# Patient Record
Sex: Female | Born: 1995 | Hispanic: No | Marital: Married | Smoking: Never smoker
Health system: Southern US, Community
[De-identification: ages and names within clinical notes are randomized; demographics above are authoritative.]

## PROBLEM LIST (undated history)

## (undated) ENCOUNTER — Inpatient Hospital Stay (HOSPITAL_COMMUNITY): Payer: Self-pay

## (undated) HISTORY — PX: NO PAST SURGERIES: SHX2092

---
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
## 2014-11-08 ENCOUNTER — Emergency Department (HOSPITAL_COMMUNITY)
Admission: EM | Admit: 2014-11-08 | Discharge: 2014-11-08 | Disposition: A | Discharge status: Home / Self Care | Payer: Medicaid Other | Attending: Emergency Medicine | Admitting: Emergency Medicine

## 2014-11-08 ENCOUNTER — Encounter (HOSPITAL_COMMUNITY): Payer: Self-pay | Admitting: Emergency Medicine

## 2014-11-08 DIAGNOSIS — L03116 Cellulitis of left lower limb: Secondary | ICD-10-CM | POA: Diagnosis not present

## 2014-11-08 DIAGNOSIS — L02419 Cutaneous abscess of limb, unspecified: Secondary | ICD-10-CM

## 2014-11-08 DIAGNOSIS — L02416 Cutaneous abscess of left lower limb: Secondary | ICD-10-CM | POA: Diagnosis present

## 2014-11-08 DIAGNOSIS — L03119 Cellulitis of unspecified part of limb: Secondary | ICD-10-CM

## 2014-11-08 LAB — POC URINE PREG, ED: PREG TEST UR: NEGATIVE

## 2014-11-08 MED ORDER — IBUPROFEN 800 MG PO TABS: 800.0000 mg | ORAL_TABLET | Freq: Three times a day (TID) | ORAL | Status: DC | PRN

## 2014-11-08 MED ORDER — SULFAMETHOXAZOLE-TRIMETHOPRIM 800-160 MG PO TABS: 1.0000 | ORAL_TABLET | Freq: Two times a day (BID) | ORAL | Status: AC

## 2014-11-08 MED ORDER — LIDOCAINE HCL (PF) 1 % IJ SOLN
30.0000 mL | Freq: Once | INTRAMUSCULAR | Status: AC
  Administered 2014-11-08: 30 mL
  Filled 2014-11-08: qty 30

## 2014-11-08 MED ORDER — CEPHALEXIN 500 MG PO CAPS: 500.0000 mg | ORAL_CAPSULE | Freq: Four times a day (QID) | ORAL | Status: DC

## 2014-11-08 MED ORDER — TETANUS-DIPHTH-ACELL PERTUSSIS 5-2.5-18.5 LF-MCG/0.5 IM SUSP
0.5000 mL | Freq: Once | INTRAMUSCULAR | Status: AC
  Administered 2014-11-08: 0.5 mL via INTRAMUSCULAR
  Filled 2014-11-08: qty 0.5

## 2014-11-08 NOTE — ED Notes (Signed)
Pt notes swollen skin abscess to left inner thigh since Monday. Denies recent fever. Pt ambulatory, distal circulation intact.

## 2014-11-08 NOTE — Discharge Instructions (Signed)
Read the information below.  Use the prescribed medication as directed.  Please discuss all new medications with your pharmacist.  You may return to the Emergency Department at any time for worsening condition or any new symptoms that concern you.   If you develop increased redness, swelling, increased pain, or fevers greater than 100.4, return to the ER immediately for a recheck.     Abscess Care After An abscess (also called a boil or furuncle) is an infected area that contains a collection of pus. Signs and symptoms of an abscess include pain, tenderness, redness, or hardness, or you may feel a moveable soft area under your skin. An abscess can occur anywhere in the body. The infection may spread to surrounding tissues causing cellulitis. A cut (incision) by the surgeon was made over your abscess and the pus was drained out. Gauze may have been packed into the space to provide a drain that will allow the cavity to heal from the inside outwards. The boil may be painful for 5 to 7 days. Most people with a boil do not have high fevers. Your abscess, if seen early, may not have localized, and may not have been lanced. If not, another appointment may be required for this if it does not get better on its own or with medications. HOME CARE INSTRUCTIONS   Only take over-the-counter or prescription medicines for pain, discomfort, or fever as directed by your caregiver.  When you bathe, soak and then remove gauze or iodoform packs at least daily or as directed by your caregiver. You may then wash the wound gently with mild soapy water. Repack with gauze or do as your caregiver directs. SEEK IMMEDIATE MEDICAL CARE IF:   You develop increased pain, swelling, redness, drainage, or bleeding in the wound site.  You develop signs of generalized infection including muscle aches, chills, fever, or a general ill feeling.  An oral temperature above 102 F (38.9 C) develops, not controlled by medication. See your  caregiver for a recheck if you develop any of the symptoms described above. If medications (antibiotics) were prescribed, take them as directed. Document Released: 08/28/2004 Document Revised: 05/04/2011 Document Reviewed: 04/25/2007 [facility] Patient Information 2015 [place], [place]. This information is not intended to replace advice given to you by your health care provider. Make sure you discuss any questions you have with your health care provider.  Abscess An abscess is an infected area that contains a collection of pus and debris.It can occur in almost any part of the body. An abscess is also known as a furuncle or boil. CAUSES  An abscess occurs when tissue gets infected. This can occur from blockage of oil or sweat glands, infection of hair follicles, or a minor injury to the skin. As the body tries to fight the infection, pus collects in the area and creates pressure under the skin. This pressure causes pain. People with weakened immune systems have difficulty fighting infections and get certain abscesses more often.  SYMPTOMS Usually an abscess develops on the skin and becomes a painful mass that is red, warm, and tender. If the abscess forms under the skin, you may feel a moveable soft area under the skin. Some abscesses break open (rupture) on their own, but most will continue to get worse without care. The infection can spread deeper into the body and eventually into the bloodstream, causing you to feel ill.  DIAGNOSIS  Your caregiver will take your medical history and perform a physical exam. A sample of  fluid may also be taken from the abscess to determine what is causing your infection. TREATMENT  Your caregiver may prescribe antibiotic medicines to fight the infection. However, taking antibiotics alone usually does not cure an abscess. Your caregiver may need to make a small cut (incision) in the abscess to drain the pus. In some cases, gauze is packed into the abscess to reduce pain  and to continue draining the area. HOME CARE INSTRUCTIONS   Only take over-the-counter or prescription medicines for pain, discomfort, or fever as directed by your caregiver.  If you were prescribed antibiotics, take them as directed. Finish them even if you start to feel better.  If gauze is used, follow your caregiver's directions for changing the gauze.  To avoid spreading the infection:  Keep your draining abscess covered with a bandage.  Wash your hands well.  Do not share personal care items, towels, or whirlpools with others.  Avoid skin contact with others.  Keep your skin and clothes clean around the abscess.  Keep all follow-up appointments as directed by your caregiver. SEEK MEDICAL CARE IF:   You have increased pain, swelling, redness, fluid drainage, or bleeding.  You have muscle aches, chills, or a general ill feeling.  You have a fever. MAKE SURE YOU:   Understand these instructions.  Will watch your condition.  Will get help right away if you are not doing well or get worse. Document Released: 11/19/2004 Document Revised: 08/11/2011 Document Reviewed: 04/24/2011 [facility] Patient Information 2015 [place], [place]. This information is not intended to replace advice given to you by your health care provider. Make sure you discuss any questions you have with your health care provider.  Cellulitis Cellulitis is an infection of the skin and the tissue beneath it. The infected area is usually red and tender. Cellulitis occurs most often in the arms and lower legs.  CAUSES  Cellulitis is caused by bacteria that enter the skin through cracks or cuts in the skin. The most common types of bacteria that cause cellulitis are staphylococci and streptococci. SIGNS AND SYMPTOMS   Redness and warmth.  Swelling.  Tenderness or pain.  Fever. DIAGNOSIS  Your health care provider can usually determine what is wrong based on a physical exam. Blood tests may also be  done. TREATMENT  Treatment usually involves taking an antibiotic medicine. HOME CARE INSTRUCTIONS   Take your antibiotic medicine as directed by your health care provider. Finish the antibiotic even if you start to feel better.  Keep the infected arm or leg elevated to reduce swelling.  Apply a warm cloth to the affected area up to 4 times per day to relieve pain.  Take medicines only as directed by your health care provider.  Keep all follow-up visits as directed by your health care provider. SEEK MEDICAL CARE IF:   You notice red streaks coming from the infected area.  Your red area gets larger or turns dark in color.  Your bone or joint underneath the infected area becomes painful after the skin has healed.  Your infection returns in the same area or another area.  You notice a swollen bump in the infected area.  You develop new symptoms.  You have a fever. SEEK IMMEDIATE MEDICAL CARE IF:   You feel very sleepy.  You develop vomiting or diarrhea.  You have a general ill feeling (malaise) with muscle aches and pains. MAKE SURE YOU:   Understand these instructions.  Will watch your condition.  Will get help right  away if you are not doing well or get worse. Document Released: 11/19/2004 Document Revised: 06/26/2013 Document Reviewed: 04/27/2011 [facility] Patient Information 2015 [place], [place]. This information is not intended to replace advice given to you by your health care provider. Make sure you discuss any questions you have with your health care provider.    Emergency Department Resource Guide 1) Find a Doctor and Pay Out of Pocket Although you won't have to find out who is covered by your insurance plan, it is a good idea to ask around and get recommendations. You will then need to call the office and see if the doctor you have chosen will accept you as a new patient and what types of options they offer for patients who are self-pay. Some doctors offer  discounts or will set up payment plans for their patients who do not have insurance, but you will need to ask so you aren't surprised when you get to your appointment.  2) Contact Your Local Health Department Not all health departments have doctors that can see patients for sick visits, but many do, so it is worth a call to see if yours does. If you don't know where your local health department is, you can check in your phone book. The CDC also has a tool to help you locate your state's health department, and many state websites also have listings of all of their local health departments.  3) Find a Walk-in Clinic If your illness is not likely to be very severe or complicated, you may want to try a walk in clinic. These are popping up all over the country in pharmacies, drugstores, and shopping centers. They're usually staffed by nurse practitioners or physician assistants that have been trained to treat common illnesses and complaints. They're usually fairly quick and inexpensive. However, if you have serious medical issues or chronic medical problems, these are probably not your best option.  No Primary Care Doctor: - Call Health Connect at  [phone] - they can help you locate a primary care doctor that  accepts your insurance, provides certain services, etc. - Physician Referral Service- [phone]  Chronic Pain Problems: Organization         Address  Phone   Notes  [facility]  [phone] Patients need to be referred by their primary care doctor.   Medication Assistance: Organization         Address  Phone   Notes  [facility] Medication [facility] [address] [place] [place], [place] [address] [phone] --Must be a resident of [facility] -- Must have NO insurance coverage whatsoever (no Medicaid/ Medicare, etc.) -- The pt. MUST have a primary care doctor that directs their care regularly and follows them in the community   [facility]   [phone]   [employer]  [phone]    Agencies that provide inexpensive medical care: Organization         Address  Phone   Notes  [facility]  [phone]   [facility]    [phone]   [facility] [address] [place], [place] [address] [phone]   [facility] [address], [place] [phone]   Planned Parenthood    [phone]   [facility]    [phone]   [facility]  [address],  Phone:  [phone], Fax:  [phone])  [phone] Hours of Operation:  9 am - 6 pm, M-F.  Also accepts Medicaid/Medicare and self-pay.  [facility]  [address], [place] Phone: [phone], Fax: [phone]. Hours of Operation:  8:30 am - 5:30 pm, M-F.  Also accepts Medicaid and self-pay.  [facility] [place] [address] [place] Phone: [phone]   [facility] [address] Natasha Bence [place], [place] [phone], Ext. [phone] Mondays & Thursdays: 7-9 AM.  First 15 patients are seen on a first come, first serve basis.    Medicaid-accepting [facility] Providers:  Organization         Address  Phone   Notes  [facility] [address], [place] [phone] Also accepts self-pay patients.  [facility] [address] Laurell Josephs [place], [place]  [phone]   [facility] [address], [place] [phone]   [facility] [address], [place] [phone]   Renaye Rakers [address], [place]   [phone] Only accepts [place] patients after they have their name applied to their card.   Self-Pay (no insurance) in [facility]:  Organization         Address  Phone   Notes  Sickle Cell Patients, [facility] Internal Medicine [address] [place], [place] [phone]   [facility] [address] [place], [place] [phone]   [facility] [place]  [address], [place] [phone]   [facility]/Dr. Osei-Bonsu  [address], [place] or [address], [place] [phone] Phone number for both [place] and [place] locations is the same.  [facility] [address], [place] [phone]   [facility] [address] [place] or [address] [phone]   [facility] [address] [place] [phone], phone; [phone], fax Sees patients 1st and 3rd Saturday of every month.  Must not qualify for public or private insurance (i.e. Medicaid, Medicare, [place] Health Choice, Veterans' Benefits)  Household income should be no more than 200% of the poverty level The clinic cannot treat you if you are pregnant or think you are pregnant  Sexually transmitted diseases are not treated at the clinic.    Dental Care: Organization         Address  Phone  Notes  [facility] Department of [facility] [address] [place], [place] [phone] Accepts children up to age 9 who are enrolled in [place] or [place] Health Choice; pregnant women with a Medicaid card; and children who have applied for Medicaid or [place] Health Choice, but were declined, whose parents can pay a reduced fee at time of service.  [facility] Department of [facility]  [address], [place] [phone] Accepts children up to age 66 who are enrolled in [place] or [place] Health Choice; pregnant women with a Medicaid card; and children who have applied for Medicaid or [place] Health Choice, but were declined, whose parents can pay a reduced fee at time of service.  [facility]  [address] [place], [place]  [phone] Patients are seen by appointment only. Walk-ins are not accepted. [facility]  will see patients 25 years of age and older. Monday - Tuesday (8am-5pm) Most Wednesdays (8:30-5pm) $30 per visit, cash only  [facility] Adult Dental Access PRO[facility]  [address], [facility] [phone] Patients are seen by appointment only. Walk-ins are not accepted. [facility] will see patients 24 years of age and older. One Wednesday Evening (Monthly: Volunteer Based).  $30 per visit, cash only  [employer]  [phone] for adults; Children under age 22, call Graduate Pediatric Dentistry at [phone]. Children aged 36-14, please call [phone] to request a pediatric application.  Dental services are provided in all areas of dental care including fillings, crowns and bridges, complete and partial dentures, implants, gum treatment, root canals, and extractions. Preventive care is also provided. Treatment is provided to both adults and children. Patients are selected via a lottery and there is often a waiting list.   [facility] [address], [place]  [phone] [email or web address]   [facility] [address] [place], [place] [phone], Ext. [phone] Second and Fourth Thursday of each month, opens at 6:30 AM; Clinic ends at 9 AM.  Patients are seen on a first-come first-served basis, and a limited number are seen during each clinic.   [facility]  [address] Ether Griffins [place], [place] [phone]   Eligibility Requirements You must have lived in [place], [place], or [place] counties for at least the last three months.   You cannot be eligible for state or federal sponsored [place], including [employer], [place], or [employer].   You generally cannot be eligible for healthcare insurance through your employer.    How to apply: Eligibility screenings are held every Tuesday and Wednesday  afternoon from 1:00 pm until 4:00 pm. You do not need an appointment for the interview!  [facility] [address] [place], [place] [phone]   [facility] Health Department  [phone]   [facility] Health Department  [phone]   [facility] Health Department  [phone]    Behavioral Health Resources in the Community: Intensive Outpatient Programs Organization         Address  Phone  Notes  [facility] Services [address], [place], [place] [phone]   [facility] Outpatient [address] [place], [place] [phone]   [facility] [address] [place], [place]  [phone]   [facility] Mental Health [address],  [place], [place] [phone] or [phone]   Substance Abuse Resources Organization         Address  Phone  Notes  [facility] Drug Services  [phone]   [facility]  [phone]   [facility] [place]  [phone]   Floydene Flock  [phone]   Residential & Outpatient Substance Abuse Program  [phone]   Psychological Services Organization         Address  Phone  Notes  [facility] Health  [phone]   [facility] Services  [phone]   [facility] Mental Health [address], [place] [phone] or [phone]    Mobile Crisis Teams Organization         Address  Phone  Notes  [facility], Mobile Crisis Care Unit  [phone]   [facility]  [address] [place], [place] [phone]   [facility] [address] [place] [phone]    Self-Help/Support Groups Organization  Address  Phone             Notes  [facility] [place] - variety of support groups  336- [phone] Call for more information  Narcotics Anonymous (NA), [facility] [address], [place]  2 meetings at this location   Nutritional therapistesidential Treatment  Programs Organization         Address  Phone  Notes  [facility] [address],    [place]  [phone]   [facility]  [address], [place] [phone], [place], [place] [phone]   [facility] [address], [place] [phone] Admissions: 8am-3pm M-F  [facility] [address]    [place], [place] [phone]   [facility] [address], [place], [place] [phone]   [facility] [address]  [place], [place] [phone]   [facility] - Intensive Outpatient [address], [place], [place] [phone]   [facility] ([facility] [address]  [place], [place] [phone] or [phone]   [facility] ([facility]) [address] [place], [place] [phone] Accepts Medicaid  [facility] [address]  [place] [phone] Substance Abuse/Addiction Treatment   [facility] Behavioral Health Resources Organization         Address  Phone  Notes  [facility]  [phone]) [phone]   Angie FavaJulie Brannon, PhD [address] [place], [place]   [phone]) [phone] or [phone]) [phone]   [facility] [facility] [place]   [address] [place], [place] [phone](336) [phone]   [facility] [address], [place], [place] [phone]) [phone] Insurance/Medicaid/sponsorship through [facility]  [facility] [address]                                    [place], [place] [phone]) [phone] Therapy/tele-psych/case  [facility] [address]   [place], [place] [phone]    Dr. Lolly MustacheArfeen  [phone]   [facility] [place]  [facility] 1) [address], [place] 2) [address], [place] 3)  [address], [place] [phone]  [phone]) [phone]   [facility] Child Abuse Hotline [phone]) 9374074544 or [phone](336) [phone] (After Hours)

## 2014-11-08 NOTE — ED Provider Notes (Signed)
CSN: [id number]     Arrival date & time 11/08/14  0806 History   First MD Initiated Contact with Patient 11/08/14 [phone]     Chief Complaint  Patient presents with  . Abscess     (Consider location/radiation/quality/duration/timing/severity/associated sxs/prior Treatment) The history is provided by the patient.     Pt presents with pain and swelling to her left medial thigh that began 6 days ago.  [place] and the next day began developing the left inner thigh pain and lesion.  Denies fevers, chills, myalgias, N/V.  States she is feeling fine.  Did not see a spider or tick.    History reviewed. No pertinent past medical history. History reviewed. No pertinent past surgical history. History reviewed. No pertinent family history. Social History  Substance Use Topics  . Smoking status: Never Smoker   . Smokeless tobacco: None  . Alcohol Use: No   OB History    Gravida Para Term Preterm AB TAB SAB Ectopic Multiple Living   2 1 1       1      Review of Systems  All other systems reviewed and are negative.     Allergies  Review of patient's allergies indicates no known allergies.  Home Medications   Prior to Admission medications   Medication Sig Start Date End Date Taking? Authorizing Provider  cephALEXin (KEFLEX) 500 MG capsule Take 1 capsule (500 mg total) by mouth 4 (four) times daily. 11/08/14   Trixie Dredge, PA-C  ibuprofen (ADVIL,MOTRIN) 800 MG tablet Take 1 tablet (800 mg total) by mouth every 8 (eight) hours as needed for mild pain or moderate pain. 11/08/14   Trixie Dredge, PA-C  sulfamethoxazole-trimethoprim (BACTRIM DS,SEPTRA DS) 800-160 MG per tablet Take 1 tablet by mouth 2 (two) times daily. 11/08/14 11/15/14  Trixie Dredge, PA-C   BP 121/66 mmHg  Pulse 78  Temp(Src) 98 F (36.7 C) (Oral)  Resp 16  SpO2 100%  LMP 09/02/2014 Physical Exam  Constitutional: She appears well-developed and well-nourished. No distress.  HENT:  Head:  Normocephalic and atraumatic.  Neck: Neck supple.  Pulmonary/Chest: Effort normal.  Neurological: She is alert.  Skin: She is not diaphoretic.  Left superior medial thigh with area of induration and surrounding erythema, tender to palpation, warm.  No discharge.   Erythema extends in an oval area measuring 10x14cm. See photo below.   Nursing note and vitals reviewed.      ED Course  Procedures (including critical care time) Labs Review Labs Reviewed  POC URINE PREG, ED    Imaging Review No results found. I have personally reviewed and evaluated these images and lab results as part of my medical decision-making.   EKG Interpretation None       EMERGENCY DEPARTMENT US SOFT TISSUE INTERPRETATION "Study: Limited Ultrasound of the noted body part in comments below"  INDICATIONS: Soft tissue infection Multiple views of the body part are obtained with a multi-frequency linear probe  PERFORMED BY:  Myself  IMAGES ARCHIVED?: Yes  SIDE:Left  BODY PART:Lower extremity  FINDINGS: Abcess present and Cellulitis present  LIMITATIONS:  Body Habitus and Emergent Procedure  INTERPRETATION:  Abcess present and Cellulitis present  COMMENT:  Left medial thigh with both cellulitis and abscess   INCISION AND DRAINAGE Performed by: Shelva Majestic, Dazia Lippold with Virgina Jock, PA-S Consent: Verbal consent obtained. Risks and benefits: risks, benefits and alternatives were discussed Type: abscess  Body area: left upper medial thigh  Anesthesia:  local infiltration  Incision was made with a scalpel.  Local anesthetic: lidocaine 2% without epinephrine  Anesthetic total: 6 ml  Complexity: complex Blunt dissection to break up loculations  Drainage: purulent  Drainage amount: small  Packing material: 1/4 in iodoform gauze  Patient tolerance: Patient tolerated the procedure well with no immediate complications.     MDM   Final diagnoses:  Cellulitis and abscess of lower extremity     Afebrile, nontoxic patient with abscess and cellulitis over left medial thigh.  I&D in department.  Edge of cellulitis marked.   D/C home with keflex, bactrim, ibuprofen, recheck in 2-3 days.  Discussed strict return precautions.  Tdap updated.  Discussed result, findings, treatment, and follow up  with patient.  Pt given return precautions.  Pt verbalizes understanding and agrees with plan.        Trixie Dredge, PA-C 11/08/14 1508  Elwin Mocha, MD 11/08/14 [phone]

## 2015-02-24 NOTE — L&D Delivery Note (Signed)
20 y/o now G3P2 at 41 wks for post dates induction.  Delivery Note At 4:52 PM a viable female was delivered via Vaginal, Spontaneous Delivery (Presentation vertex ROA, delivered in call).  APGAR: 8, 9; weight  pending.   Placenta status:delivered with gentle traction.  Cord: 3 vessel with the following complications: none.  Anesthesia:  epidural Episiotomy: None Lacerations: None Est. Blood Loss (mL): 300  Mom to postpartum.  Baby to Couplet care / Skin to Skin.  Ernestina Pennaicholas Schenk 11/28/2015, 5:38 PM

## 2015-04-08 ENCOUNTER — Encounter (HOSPITAL_COMMUNITY): Payer: Self-pay | Admitting: *Deleted

## 2015-04-08 ENCOUNTER — Inpatient Hospital Stay (HOSPITAL_COMMUNITY)
Admission: AD | Admit: 2015-04-08 | Discharge: 2015-04-08 | Disposition: A | Discharge status: Home / Self Care | Payer: Medicaid Other | Source: Ambulatory Visit | Attending: Family Medicine | Admitting: Family Medicine

## 2015-04-08 DIAGNOSIS — Z3A01 Less than 8 weeks gestation of pregnancy: Secondary | ICD-10-CM | POA: Insufficient documentation

## 2015-04-08 DIAGNOSIS — Z3201 Encounter for pregnancy test, result positive: Secondary | ICD-10-CM | POA: Diagnosis not present

## 2015-04-08 LAB — URINE MICROSCOPIC-ADD ON

## 2015-04-08 LAB — URINALYSIS, ROUTINE W REFLEX MICROSCOPIC
Bilirubin Urine: NEGATIVE
GLUCOSE, UA: NEGATIVE mg/dL
Ketones, ur: 15 mg/dL — AB
Nitrite: NEGATIVE
PROTEIN: NEGATIVE mg/dL
Specific Gravity, Urine: 1.025 (ref 1.005–1.030)
pH: 5.5 (ref 5.0–8.0)

## 2015-04-08 LAB — POCT PREGNANCY, URINE: Preg Test, Ur: POSITIVE — AB

## 2015-04-08 MED ORDER — PRENATAL VITAMINS 28-0.8 MG PO TABS: 1.0000 | ORAL_TABLET | Freq: Every day | ORAL | Status: DC

## 2015-04-08 NOTE — MAU Note (Signed)
Having cramping in lower abd.  +HPT on Sat.  Is scared because she has already had a miscarriage.

## 2015-04-08 NOTE — Discharge Instructions (Signed)
Pregnancy, The Father's Role A father has an important role during his partner's pregnancy, labor, delivery, and after the birth of the baby. It is important to help and support your partner through this new period. There are many physical and emotional changes that happen. To be helpful and supportive during this time, you should know and understand what is happening to your partner during pregnancy, labor, delivery, and after the baby is born. WHAT ARE THE STAGES OF PREGNANCY? Pregnancy usually lasts about 40 weeks. The pregnancy is divided into three trimesters. First Trimester During the first 13 weeks, your partner may:  Feel tired.  Have painful breasts.  Feel nauseous or throw up.  Urinate more often.  Have mood changes. All of these changes are normal. If they are happening, try to be helpful, supportive, and understanding. This may include helping with household duties and activities and spending more time with each other. Second Trimester During the next 14-28 weeks:  Your partner will likely feel better and more energetic.  This is the best time of the pregnancy to be more active together.  You will be able to see her belly showing the pregnancy.  You may be able to feel the baby kick.  Your partner may have soreness or aching in her back as she gains weight. You can help her by carrying heavy things and by rubbing her back when she is feeling sore. Third Trimester During the final 12 weeks, your partner may:  Become more uncomfortable as the baby grows.  Have a hard time doing everyday activities, and her balance may be off.  Have a hard time bending over.  Tire easily.  Have difficulty sleeping. At this time, the birth of your baby is close. You and your partner may have concerns or questions. This is normal. Talk with each other and with your health care provider. Continue to help your partner with housework, encourage her to rest, and rub her sore back and  legs, if this helps her. WHAT CAN I EXPECT OR DO DURING THE PREGNANCY? You can expect to experience some changes. There are also many things you can do to help prepare you and your partner for your baby. Emotional Changes During your partner's pregnancy, emotional changes for you may include:  Having feelings of happiness, excitement, and pride.  Being concerned about having new responsibilities, such as financial or educational responsibilities.  Feeling overwhelmed or scared.  Being worried that a baby will change your relationship with your partner. These feelings are normal. Talk about them openly with your partner and your health care provider. Prenatal Care Attend prenatal care visits with your partner. This is a good time for you to get to know your health care provider, follow the pregnancy, and ask questions.  Prenatal visits usually occur one time each month for six months, then every two weeks for two months, and then one time each week during the last month. You may have more prenatal visits if your health care provider believes this is needed.  Your health care provider usually does an ultrasound of the baby at one of the prenatal visits. This may happen more often if your health care provider thinks it is needed. Sexual Activity Sexual intercourse is safe unless there is a problem with the pregnancy and your health care provider advises you to not have sexual intercourse. Because physical and emotional changes happen in pregnancy, your partner may not want to have sex during certain times. Trying different positions may  make sexual intercourse more comfortable. However, always respect your partner's decision if she does not want to have sex. It is important for both of you to discuss your feelings and desires. Talk with your health care provider about any questions that you may have about sexual intercourse during pregnancy. Childbirth Classes Attend childbirth classes with your  partner if you are able. Classes prepare you and help you to understand what happens during labor and delivery, and they help you and your partner to bond. There are even some classes that are only for new fathers. Classes also teach you and your partner:  Various relaxation techniques.  How to work with her labor pains.  How to focus during labor and delivery. WHAT SHOULD I KNOW ABOUT LABOR AND DELIVERY? Many fathers want to be present while their partner is going through labor and delivery. You may:  Be asked to time the contractions, massage your partner's back, and breathe with her during the contractions.  Get to see and enjoy the excitement of your baby being born, and you may even be able to cut your baby's umbilical cord. If you feel like you might faint or you are uncomfortable, ask someone to help you.  Need to leave the room if a problem develops during labor or delivery. A cesarean delivery, or C-section, is a procedure that may be used to deliver the baby. It is done through an incision in the abdomen and the uterus. A cesarean delivery may be scheduled or it may be an emergency procedure during labor and delivery. Most hospitals allow the father to be in the room for a cesarean delivery unless it is an emergency. Recovery from a cesarean delivery usually requires more help from the father. WHAT HAPPENS AFTER DELIVERY? After your baby is born, your partner will go through many changes again. These changes could last a few months or longer. Postpartum Depression Your partner may take awhile to regain her strength. She may also have feelings of sadness (postpartum blues or postpartum depression). If your partner is acting unusually sad or depressed, talk with your health care provider right away. This can be a serious medical condition that requires treatment. Breastfeeding Your partner may decide to breastfeed the baby. This helps with bonding between the mother and the baby, and  breast milk is the best nutrition for your baby. You can feel included by burping the baby and bottle-feeding the baby with breast milk that was collected from the mother. This allows your partner to rest and helps you to bond with your baby. Sexual Activity It may take a few months for your partner's body to heal and be ready for sexual intercourse again. This may take longer after a cesarean delivery. If you have any questions about having sexual intercourse or if it is painful for your partner, talk with your health care provider. It is possible for breastfeeding mothers to become pregnant even if they are not having menstrual periods. Use birth control (contraception) unless you and your partner would like to become pregnant again. WHAT SHOULD I REMEMBER? Fatherhood and having a baby is an ongoing learning experience. It is common to be anxious, concerned, or afraid that you may not be taking care of your newborn baby properly. It is important to talk with your partner and your health care provider if you are worried or have any questions.   This information is not intended to replace advice given to you by your health care provider. Make sure you  discuss any questions you have with your health care provider.   Document Released: 07/29/2007 Document Revised: 03/02/2014 Document Reviewed: 10/27/2013 Elsevier Interactive Patient Education 2016 [employer].  Prenatal Care WHAT IS PRENATAL CARE?  Prenatal care is the process of caring for a pregnant woman before she gives birth. Prenatal care makes sure that she and her baby remain as healthy as possible throughout pregnancy. Prenatal care may be provided by a midwife, family practice health care provider, or a childbirth and pregnancy specialist (obstetrician). Prenatal care may include physical examinations, testing, treatments, and education on nutrition, lifestyle, and social support services. WHY IS PRENATAL CARE SO IMPORTANT?  Early and  consistent prenatal care increases the chance that you and your baby will remain healthy throughout your pregnancy. This type of care also decreases a baby's risk of being born too early (prematurely), or being born smaller than expected (small for gestational age). Any underlying medical conditions you may have that could pose a risk during your pregnancy are discussed during prenatal care visits. You will also be monitored regularly for any new conditions that may arise during your pregnancy so they can be treated quickly and effectively. WHAT HAPPENS DURING PRENATAL CARE VISITS? Prenatal care visits may include the following: Discussion Tell your health care provider about any new signs or symptoms you have experienced since your last visit. These might include:  Nausea or vomiting.  Increased or decreased level of energy.  Difficulty sleeping.  Back or leg pain.  Weight changes.  Frequent urination.  Shortness of breath with physical activity.  Changes in your skin, such as the development of a rash or itchiness.  Vaginal discharge or bleeding.  Feelings of excitement or nervousness.  Changes in your baby's movements. You may want to write down any questions or topics you want to discuss with your health care provider and bring them with you to your appointment. Examination During your first prenatal care visit, you will likely have a complete physical exam. Your health care provider will often examine your vagina, cervix, and the position of your uterus, as well as check your heart, lungs, and other body systems. As your pregnancy progresses, your health care provider will measure the size of your uterus and your baby's position inside your uterus. He or she may also examine you for early signs of labor. Your prenatal visits may also include checking your blood pressure and, after about 10-12 weeks of pregnancy, listening to your baby's heartbeat. Testing Regular testing often  includes:  Urinalysis. This checks your urine for glucose, protein, or signs of infection.  Blood count. This checks the levels of white and red blood cells in your body.  Tests for sexually transmitted infections (STIs). Testing for STIs at the beginning of pregnancy is routinely done and is required in many states.  Antibody testing. You will be checked to see if you are immune to certain illnesses, such as rubella, that can affect a developing fetus.  Glucose screen. Around 24-28 weeks of pregnancy, your blood glucose level will be checked for signs of gestational diabetes. Follow-up tests may be recommended.  Group B strep. This is a bacteria that is commonly found inside a woman's vagina. This test will inform your health care provider if you need an antibiotic to reduce the amount of this bacteria in your body prior to labor and childbirth.  Ultrasound. Many pregnant women undergo an ultrasound screening around 18-20 weeks of pregnancy to evaluate the health of the fetus and check  for any developmental abnormalities.  HIV (human immunodeficiency virus) testing. Early in your pregnancy, you will be screened for HIV. If you are at high risk for HIV, this test may be repeated during your third trimester of pregnancy. You may be offered other testing based on your age, personal or family medical history, or other factors.  HOW OFTEN SHOULD I PLAN TO SEE MY HEALTH CARE PROVIDER FOR PRENATAL CARE? Your prenatal care check-up schedule depends on any medical conditions you have before, or develop during, your pregnancy. If you do not have any underlying medical conditions, you will likely be seen for checkups:  Monthly, during the first 6 months of pregnancy.  Twice a month during months 7 and 8 of pregnancy.  Weekly starting in the 9th month of pregnancy and until delivery. If you develop signs of early labor or other concerning signs or symptoms, you may need to see your health care  provider more often. Ask your health care provider what prenatal care schedule is best for you. WHAT CAN I DO TO KEEP MYSELF AND MY BABY AS HEALTHY AS POSSIBLE DURING MY PREGNANCY?  Take a prenatal vitamin containing 400 micrograms (0.4 mg) of folic acid every day. Your health care provider may also ask you to take additional vitamins such as iodine, vitamin D, iron, copper, and zinc.  Take 1500-2000 mg of calcium daily starting at your 20th week of pregnancy until you deliver your baby.  Make sure you are up to date on your vaccinations. Unless directed otherwise by your health care provider:  You should receive a tetanus, diphtheria, and pertussis (Tdap) vaccination between the 27th and 36th week of your pregnancy, regardless of when your last Tdap immunization occurred. This helps protect your baby from whooping cough (pertussis) after he or she is born.  You should receive an annual inactivated influenza vaccine (IIV) to help protect you and your baby from influenza. This can be done at any point during your pregnancy.  Eat a well-rounded diet that includes:  Fresh fruits and vegetables.  Lean proteins.  Calcium-rich foods such as milk, yogurt, hard cheeses, and dark, leafy greens.  Whole grain breads.  Do noteat seafood high in mercury, including:  Swordfish.  Tilefish.  Shark.  King mackerel.  More than 6 oz tuna per week.  Do not eat:  Raw or undercooked meats or eggs.  Unpasteurized foods, such as soft cheeses (brie, blue, or feta), juices, and milks.  Lunch meats.  Hot dogs that have not been heated until they are steaming.  Drink enough water to keep your urine clear or pale yellow. For many women, this may be 10 or more 8 oz glasses of water each day. Keeping yourself hydrated helps deliver nutrients to your baby and may prevent the start of pre-term uterine contractions.  Do not use any tobacco products including cigarettes, chewing tobacco, or electronic  cigarettes. If you need help quitting, ask your health care provider.  Do not drink beverages containing alcohol. No safe level of alcohol consumption during pregnancy has been determined.  Do not use any illegal drugs. These can harm your developing baby or cause a miscarriage.  Ask your health care provider or pharmacist before taking any prescription or over-the-counter medicines, herbs, or supplements.  Limit your caffeine intake to no more than 200 mg per day.  Exercise. Unless told otherwise by your health care provider, try to get 30 minutes of moderate exercise most days of the week. Do not  do  high-impact activities, contact sports, or activities with a high risk of falling, such as horseback riding or downhill skiing.  Get plenty of rest.  Avoid anything that raises your body temperature, such as hot tubs and saunas.  If you own a cat, do not empty its litter box. Bacteria contained in cat feces can cause an infection called toxoplasmosis. This can result in serious harm to the fetus.  Stay away from chemicals such as insecticides, lead, mercury, and cleaning or paint products that contain solvents.  Do not have any X-rays taken unless medically necessary.  Take a childbirth and breastfeeding preparation class. Ask your health care provider if you need a referral or recommendation.   This information is not intended to replace advice given to you by your health care provider. Make sure you discuss any questions you have with your health care provider.   Document Released: 02/12/2003 Document Revised: 03/02/2014 Document Reviewed: 04/26/2013 Elsevier Interactive Patient Education [employer]

## 2015-04-08 NOTE — MAU Provider Note (Signed)
S:  Linda Bond is a 20 y.o. G3P1001 at [redacted]w[redacted]d wks here for confirmation of pregnancy.  Patient's last menstrual period was 02/19/2015.Marland Kitchen  Denies any vaginal bleeding or abdominal pain at this time. She reports occasional abdominal pain, with the last pain in her abdomen felt yesterday. No pain now and no pain today.  She Plans to get prenatal care at the [facility].     O:  No past medical history on file.  No family history on file.   Filed Vitals:   04/08/15 1708  BP: 119/60  Pulse: 63  Temp: 97.7 F (36.5 C)  Resp: 18   General:  A&OX3 with no signs of acute distress. She appears well-developed and well-nourished. No distress.  Neck: Normal range of motion.  Pulmonary/Chest: Effort normal. No respiratory distress.  Musculoskeletal: Normal range of motion.  Neurological: She is alert and oriented to person, place, and time.  Skin: Skin is warm and dry.    A: Positive Pregnancy Test Missed period   P: Explained benefits of taking prenatal vitamins w/folic acid early in pregnancy. Begin prenatal care. Reviewed warning signs of pregnancy. Return to MAU if symptoms worsen   Duane Lope, NP  Duane Lope, NP 04/08/2015 5:37 PM

## 2015-04-09 ENCOUNTER — Encounter (HOSPITAL_COMMUNITY): Payer: Self-pay | Admitting: *Deleted

## 2015-04-09 ENCOUNTER — Inpatient Hospital Stay (HOSPITAL_COMMUNITY)
Admission: AD | Admit: 2015-04-09 | Discharge: 2015-04-09 | Disposition: A | Discharge status: Home / Self Care | Payer: Medicaid Other | Source: Ambulatory Visit | Attending: Obstetrics & Gynecology | Admitting: Obstetrics & Gynecology

## 2015-04-09 DIAGNOSIS — R51 Headache: Secondary | ICD-10-CM | POA: Diagnosis not present

## 2015-04-09 DIAGNOSIS — Z3A01 Less than 8 weeks gestation of pregnancy: Secondary | ICD-10-CM | POA: Diagnosis not present

## 2015-04-09 DIAGNOSIS — O26891 Other specified pregnancy related conditions, first trimester: Secondary | ICD-10-CM | POA: Diagnosis not present

## 2015-04-09 DIAGNOSIS — O9989 Other specified diseases and conditions complicating pregnancy, childbirth and the puerperium: Secondary | ICD-10-CM

## 2015-04-09 DIAGNOSIS — R519 Headache, unspecified: Secondary | ICD-10-CM

## 2015-04-09 LAB — URINALYSIS, ROUTINE W REFLEX MICROSCOPIC
Bilirubin Urine: NEGATIVE
Glucose, UA: NEGATIVE mg/dL
KETONES UR: NEGATIVE mg/dL
NITRITE: NEGATIVE
PROTEIN: NEGATIVE mg/dL
Specific Gravity, Urine: 1.01 (ref 1.005–1.030)
pH: 6.5 (ref 5.0–8.0)

## 2015-04-09 LAB — URINE MICROSCOPIC-ADD ON

## 2015-04-09 MED ORDER — ACETAMINOPHEN 500 MG PO TABS
1000.0000 mg | ORAL_TABLET | Freq: Once | ORAL | Status: AC
  Administered 2015-04-09: 1000 mg via ORAL
  Filled 2015-04-09: qty 2

## 2015-04-09 NOTE — MAU Note (Signed)
Pt reports onset of headache a little while ago and her husband told her she was getting a fever and then pt started having chills.

## 2015-04-09 NOTE — MAU Provider Note (Signed)
History     CSN: [id number]  Arrival date and time: 04/09/15 2121   First Provider Initiated Contact with Patient 04/09/15 2200      Chief Complaint  Patient presents with  . Headache   HPI  [name] is a 20 y.o. G3P1011 at [redacted]w[redacted]d who presents to MAU today with complaint of headache and possible fever. The patient states that she woke up this evening and had a headache. She rates pain at 7/10 now. She has not taken anything for pain. She also states that her husband told her she felt warm to touch, but she denies true fever. She does endorse chills which started after she thought she might have a fever. She has had intermittent nausea without vomiting, diarrhea, constipation. She also denies vaginal bleeding, UTI or URI symptoms. She plans to go to [facility]. She denies complications with her previous pregnancy.    OB History    Gravida Para Term Preterm AB TAB SAB Ectopic Multiple Living   History reviewed. No pertinent past medical history.  History reviewed. No pertinent past surgical history.  History reviewed. No pertinent family history.  Social History  Substance Use Topics  . Smoking status: Never Smoker   . Smokeless tobacco: None  . Alcohol Use: No    Allergies: No Known Allergies  Prescriptions prior to admission  Medication Sig Dispense Refill Last Dose  . Prenatal Vit-Fe Fumarate-FA (PRENATAL VITAMINS) 28-0.8 MG TABS Take 1 tablet by mouth daily. 30 tablet 6 04/09/2015 at Unknown time    Review of Systems  Constitutional: Positive for chills. Negative for fever and malaise/fatigue.  Eyes: Negative for blurred vision.  Gastrointestinal: Positive for nausea. Negative for vomiting, abdominal pain, diarrhea and constipation.  Genitourinary: Negative for dysuria, urgency and frequency.       Neg - vaginal bleeding  Neurological: Positive for headaches.   Physical Exam   Blood pressure 125/69, pulse 75, temperature 97.7  F (36.5 C), temperature source Oral, resp. rate 16, height 5' 4.5" (1.638 m), weight 224 lb (101.606 kg), last menstrual period 02/19/2015, SpO2 98 %, unknown if currently breastfeeding.  Physical Exam  Nursing note and vitals reviewed. Constitutional: She is oriented to person, place, and time. She appears well-developed and well-nourished. No distress.  HENT:  Head: Normocephalic and atraumatic.  Cardiovascular: Normal rate.   Respiratory: Effort normal.  GI: Soft. She exhibits no distension and no mass. There is no tenderness. There is no rebound and no guarding.  Neurological: She is alert and oriented to person, place, and time.  Skin: Skin is warm and dry. No erythema.  Psychiatric: She has a normal mood and affect.    Results for orders placed or performed during the hospital encounter of 04/09/15 (from the past 24 hour(s))  Urinalysis, Routine w reflex microscopic (not at [facility])     Status: Abnormal   Collection Time: 04/09/15  9:33 PM  Result Value Ref Range   Color, Urine YELLOW YELLOW   APPearance CLEAR CLEAR   Specific Gravity, Urine 1.010 1.005 - 1.030   pH 6.5 5.0 - 8.0   Glucose, UA NEGATIVE NEGATIVE mg/dL   Hgb urine dipstick MODERATE (A) NEGATIVE   Bilirubin Urine NEGATIVE NEGATIVE   Ketones, ur NEGATIVE NEGATIVE mg/dL   Protein, ur NEGATIVE NEGATIVE mg/dL   Nitrite NEGATIVE NEGATIVE   Leukocytes, UA MODERATE (A) NEGATIVE  Urine microscopic-add on  Status: Abnormal   Collection Time: 04/09/15  9:33 PM  Result Value Ref Range   Squamous Epithelial / LPF 0-5 (A) NONE SEEN   WBC, UA 6-30 0 - 5 WBC/hpf   RBC / HPF 0-5 0 - 5 RBC/hpf   Bacteria, UA RARE (A) NONE SEEN    MAU Course  Procedures None  MDM UA today with moderate leukocytes and rare bacteria. Given patient is asymptomatic, will await culture results.  Urine culture ordered.  1000 mg Tylenol given for headache  Patient reports headache is rated at 3/10 < 30 minutes after Tylenol given. She is  requesting discharge at this time.  Assessment and Plan  A: Positive pregnancy test Headache   P: Discharge home in stable condition Advised Tylenol PRN for headache OTC medications safe in pregnancy given First trimester precautions discussed Patient advised to follow-up with [facility] or prenatal provider of choice to start prenatal care Patient may return to MAU as needed or if her condition were to change or worsen   Marny Lowenstein, PA-C  04/09/2015, 11:04 PM

## 2015-04-09 NOTE — Discharge Instructions (Signed)

## 2015-04-09 NOTE — MAU Note (Signed)
Pt reports that she feels chills, nauseated and a headache since 7:30 pm. Pt has not taken any medicine at home.

## 2015-04-11 LAB — CULTURE, OB URINE

## 2015-05-13 ENCOUNTER — Other Ambulatory Visit (HOSPITAL_COMMUNITY): Payer: Self-pay | Admitting: Nurse Practitioner

## 2015-05-13 DIAGNOSIS — Z3682 Encounter for antenatal screening for nuchal translucency: Secondary | ICD-10-CM

## 2015-05-13 LAB — OB RESULTS CONSOLE HEPATITIS B SURFACE ANTIGEN: HEP B S AG: NEGATIVE

## 2015-05-13 LAB — OB RESULTS CONSOLE RUBELLA ANTIBODY, IGM: Rubella: IMMUNE

## 2015-05-13 LAB — OB RESULTS CONSOLE GC/CHLAMYDIA
CHLAMYDIA, DNA PROBE: NEGATIVE
GC PROBE AMP, GENITAL: NEGATIVE

## 2015-05-13 LAB — OB RESULTS CONSOLE RPR: RPR: NONREACTIVE

## 2015-05-13 LAB — OB RESULTS CONSOLE HIV ANTIBODY (ROUTINE TESTING): HIV: NONREACTIVE

## 2015-05-13 LAB — OB RESULTS CONSOLE ABO/RH: RH Type: POSITIVE

## 2015-05-13 LAB — OB RESULTS CONSOLE ANTIBODY SCREEN: ANTIBODY SCREEN: NEGATIVE

## 2015-05-20 ENCOUNTER — Encounter (HOSPITAL_COMMUNITY): Payer: Self-pay

## 2015-05-20 ENCOUNTER — Ambulatory Visit (HOSPITAL_COMMUNITY): Admission: RE | Admit: 2015-05-20 | Payer: Medicaid Other | Source: Ambulatory Visit

## 2015-05-20 ENCOUNTER — Ambulatory Visit (HOSPITAL_COMMUNITY)
Admission: RE | Admit: 2015-05-20 | Discharge: 2015-05-20 | Disposition: A | Discharge status: Home / Self Care | Payer: Medicaid Other | Source: Ambulatory Visit | Attending: Nurse Practitioner | Admitting: Nurse Practitioner

## 2015-05-20 DIAGNOSIS — Z3A13 13 weeks gestation of pregnancy: Secondary | ICD-10-CM | POA: Diagnosis not present

## 2015-05-20 DIAGNOSIS — Z36 Encounter for antenatal screening of mother: Secondary | ICD-10-CM | POA: Diagnosis present

## 2015-05-20 DIAGNOSIS — Z3682 Encounter for antenatal screening for nuchal translucency: Secondary | ICD-10-CM

## 2015-08-15 ENCOUNTER — Inpatient Hospital Stay (HOSPITAL_COMMUNITY)
Admission: AD | Admit: 2015-08-15 | Discharge: 2015-08-16 | Disposition: A | Discharge status: Home / Self Care | Payer: Medicaid Other | Source: Ambulatory Visit | Attending: Obstetrics & Gynecology | Admitting: Obstetrics & Gynecology

## 2015-08-15 DIAGNOSIS — Z3A26 26 weeks gestation of pregnancy: Secondary | ICD-10-CM | POA: Insufficient documentation

## 2015-08-15 DIAGNOSIS — O210 Mild hyperemesis gravidarum: Secondary | ICD-10-CM | POA: Insufficient documentation

## 2015-08-15 DIAGNOSIS — K219 Gastro-esophageal reflux disease without esophagitis: Secondary | ICD-10-CM

## 2015-08-15 DIAGNOSIS — IMO0001 Reserved for inherently not codable concepts without codable children: Secondary | ICD-10-CM

## 2015-08-16 ENCOUNTER — Encounter (HOSPITAL_COMMUNITY): Payer: Self-pay | Admitting: *Deleted

## 2015-08-16 DIAGNOSIS — O99612 Diseases of the digestive system complicating pregnancy, second trimester: Secondary | ICD-10-CM | POA: Diagnosis not present

## 2015-08-16 DIAGNOSIS — K219 Gastro-esophageal reflux disease without esophagitis: Secondary | ICD-10-CM | POA: Diagnosis not present

## 2015-08-16 DIAGNOSIS — O210 Mild hyperemesis gravidarum: Secondary | ICD-10-CM | POA: Diagnosis present

## 2015-08-16 DIAGNOSIS — Z3A26 26 weeks gestation of pregnancy: Secondary | ICD-10-CM | POA: Diagnosis not present

## 2015-08-16 LAB — URINALYSIS, ROUTINE W REFLEX MICROSCOPIC
Bilirubin Urine: NEGATIVE
GLUCOSE, UA: NEGATIVE mg/dL
KETONES UR: NEGATIVE mg/dL
Nitrite: NEGATIVE
PROTEIN: NEGATIVE mg/dL
Specific Gravity, Urine: 1.02 (ref 1.005–1.030)
pH: 6 (ref 5.0–8.0)

## 2015-08-16 LAB — URINE MICROSCOPIC-ADD ON

## 2015-08-16 MED ORDER — OMEPRAZOLE 20 MG PO CPDR: 20.0000 mg | DELAYED_RELEASE_CAPSULE | Freq: Every day | ORAL | Status: DC

## 2015-08-16 MED ORDER — DOXYLAMINE-PYRIDOXINE 10-10 MG PO TBEC: DELAYED_RELEASE_TABLET | ORAL | Status: DC

## 2015-08-16 MED ORDER — CITRANATAL 90 DHA 90-1 & 300 MG PO MISC: 1.0000 | Freq: Every day | ORAL | Status: DC

## 2015-08-16 NOTE — MAU Note (Signed)
PT  SAYS  SHE HAS BEEN NAUSEA  ALL PREG.      TONIGHT AT 2230-  SHE  LAYED DOWN-  VOMITED-  SAW  SPECKS  OF    BLOOD  AND  THEN STARTED CRAMPING.      NO CRAMPS   NOW   .    FEELS  BABY MOVING.     PNC-    HD.

## 2015-08-16 NOTE — MAU Provider Note (Signed)
None     Chief Complaint:  Emesis   Linda GrahamDaisy J Bond is  20 y.o. G3P1011 at 5480w1d presents complaining of Emesis She noticed some flecks of blood, and is concerned.  Has had nausea the whole pregnancy.  Has not been offered Diclegis. States Zofran and phenergan dont help. Wants to try a smaller PNV.  MAU is backed up and there is not a room for this pt. She declines to stay for EFM.    Obstetrical/Gynecological History: OB History    Gravida Para Term Preterm AB TAB SAB Ectopic Multiple Living   3 1 1  1  1   1      Past Medical History: No past medical history on file.  Past Surgical History: No past surgical history on file.  Family History: No family history on file.  Social History: Social History  Substance Use Topics  . Smoking status: Never Smoker   . Smokeless tobacco: None  . Alcohol Use: No    Allergies: No Known Allergies  Meds:  Prescriptions prior to admission  Medication Sig Dispense Refill Last Dose  . Prenatal Vit-Fe Fumarate-FA (PRENATAL VITAMINS) 28-0.8 MG TABS Take 1 tablet by mouth daily. 30 tablet 6 Taking    Review of Systems   Constitutional: Negative for fever and chills Eyes: Negative for visual disturbances Respiratory: Negative for shortness of breath, dyspnea Cardiovascular: Negative for chest pain or palpitations  Gastrointestinal: Negative fordiarrhea and constipation Genitourinary: Negative for dysuria and urgency Musculoskeletal: Negative for back pain, joint pain, myalgias.  Normal ROM  Neurological: Negative for dizziness and headaches    Physical Exam  Blood pressure 99/53, pulse 91, temperature 98.7 F (37.1 C), temperature source Oral, resp. rate 18, height 5\' 3"  (1.6 m), weight 95.029 kg (209 lb 8 oz), last menstrual period 02/14/2015, unknown if currently breastfeeding. GENERAL: Well-developed, well-nourished female in no acute distress.  LUNGS: normal resp effort HEART: Regular rate and rhythm. ABDOMEN: Soft, nontender,  nondistended, gravid.  EXTREMITIES: Nontender, no edema, 2+ distal pulses. DTR's 2+ FHR 150 doppler   Labs: Results for orders placed or performed during the hospital encounter of 08/15/15 (from the past 24 hour(s))  Urinalysis, Routine w reflex microscopic (not at [facility])   Collection Time: 08/16/15 12:57 AM  Result Value Ref Range   Color, Urine YELLOW YELLOW   APPearance HAZY (A) CLEAR   Specific Gravity, Urine 1.020 1.005 - 1.030   pH 6.0 5.0 - 8.0   Glucose, UA NEGATIVE NEGATIVE mg/dL   Hgb urine dipstick SMALL (A) NEGATIVE   Bilirubin Urine NEGATIVE NEGATIVE   Ketones, ur NEGATIVE NEGATIVE mg/dL   Protein, ur NEGATIVE NEGATIVE mg/dL   Nitrite NEGATIVE NEGATIVE   Leukocytes, UA MODERATE (A) NEGATIVE  Urine microscopic-add on   Collection Time: 08/16/15 12:57 AM  Result Value Ref Range   Squamous Epithelial / LPF 6-30 (A) NONE SEEN   WBC, UA 6-30 0 - 5 WBC/hpf   RBC / HPF 0-5 0 - 5 RBC/hpf   Bacteria, UA FEW (A) NONE SEEN   Urine-Other MUCOUS PRESENT    Imaging Studies:  No results found.  Assessment: Linda Bond is  20 y.o. G3P1011 at 6980w1d presents with N/V, probable reflux.  Plan:  Meds ordered this encounter  Medications  . Doxylamine-Pyridoxine 10-10 MG TBEC    Sig: 2 PO qhs; may take 1po in am and 1po in afternoon prn nausea    Dispense:  120 tablet    Refill:  3  Order Specific Question:  Supervising Provider    Answer:  Adam PhenixARNOLD, JAMES G [[id number]]  . Prenat w/o A-FeCbGl-DSS-FA-DHA (CITRANATAL 90 DHA) 90-1 & 300 MG MISC    Sig: Take 1 tablet by mouth daily.    Dispense:  30 each    Refill:  6    Order Specific Question:  Supervising Provider    Answer:  Adam PhenixARNOLD, JAMES G [[id number]]  . omeprazole (PRILOSEC) 20 MG capsule    Sig: Take 1 capsule (20 mg total) by mouth daily. For acid reflux    Dispense:  30 capsule    Refill:  3    Order Specific Question:  Supervising Provider    Answer:  Adam PhenixRNOLD, JAMES G [[id number]]     Bond,Linda Hosea 6/23/20171:30  AM

## 2015-09-06 ENCOUNTER — Inpatient Hospital Stay (HOSPITAL_COMMUNITY)
Admission: AD | Admit: 2015-09-06 | Discharge: 2015-09-07 | Disposition: A | Discharge status: Home / Self Care | Payer: Medicaid Other | Source: Ambulatory Visit | Attending: Obstetrics & Gynecology | Admitting: Obstetrics & Gynecology

## 2015-09-06 DIAGNOSIS — O4703 False labor before 37 completed weeks of gestation, third trimester: Secondary | ICD-10-CM | POA: Diagnosis not present

## 2015-09-06 DIAGNOSIS — Z3A29 29 weeks gestation of pregnancy: Secondary | ICD-10-CM | POA: Insufficient documentation

## 2015-09-06 DIAGNOSIS — R102 Pelvic and perineal pain: Secondary | ICD-10-CM | POA: Diagnosis present

## 2015-09-06 DIAGNOSIS — O26893 Other specified pregnancy related conditions, third trimester: Secondary | ICD-10-CM | POA: Diagnosis not present

## 2015-09-06 NOTE — MAU Note (Signed)
When I laid down I felt her (baby) move down and she feels too low. Denies LOF or bleeding. NO pain

## 2015-09-07 ENCOUNTER — Encounter (HOSPITAL_COMMUNITY): Payer: Self-pay | Admitting: *Deleted

## 2015-09-07 DIAGNOSIS — O4703 False labor before 37 completed weeks of gestation, third trimester: Secondary | ICD-10-CM

## 2015-09-07 LAB — URINALYSIS, ROUTINE W REFLEX MICROSCOPIC
BILIRUBIN URINE: NEGATIVE
GLUCOSE, UA: NEGATIVE mg/dL
KETONES UR: NEGATIVE mg/dL
Nitrite: NEGATIVE
PROTEIN: NEGATIVE mg/dL
Specific Gravity, Urine: 1.02 (ref 1.005–1.030)
pH: 6 (ref 5.0–8.0)

## 2015-09-07 LAB — URINE MICROSCOPIC-ADD ON: RBC / HPF: NONE SEEN RBC/hpf (ref 0–5)

## 2015-09-07 NOTE — Discharge Instructions (Signed)
Dolor abdominal durante el embarazo  (Abdominal Pain During Pregnancy)  El dolor de vientre (abdominal) es habitual durante el embarazo. Generalmente no se trata de un problema grave. Otras veces puede ser un signo de que algo no anda bien. Siempre comuníquese con su médico si tiene dolor abdominal.  CUIDADOS EN EL HOGAR  Controle el dolor para ver si hay cambios. Las indicaciones que siguen pueden ayudarla a sentirse mejor:  · Notenga sexo (relaciones sexuales) ni se coloque nada dentro de la vagina hasta que se sienta mejor.  · Haga reposo hasta que el dolor se calme.  · Si siente ganas de vomitar (náuseas ) beba líquidos claros. No consuma alimentos sólidos hasta que se sienta mejor.  · Sólo tome los medicamentos que le haya indicado su médico.  · Cumpla con las visitas al médico según las indicaciones.  SOLICITE AYUDA DE INMEDIATO SI:   · Tiene un sangrado, pierde líquido o elimina trozos de tejido por la vagina.  · Siente más dolor o cólicos.  · Comienza a vomitar.  · Siente dolor al orinar u observa sangre en la orina.  · Tiene fiebre.  · No siente que el bebé se mueva mucho.  · Se siente muy débil o cree que va a desmayarse.  · Tiene dificultad para respirar con o sin dolor en el vientre.  · Siente un dolor de cabeza muy intenso y dolor en el vientre.  · Observa que sale un líquido por la vagina y tiene dolor abdominal.  · La materia fecal es líquida (diarrea).  · El dolor en el viente no desaparece, o empeora, luego de hacer reposo.  ASEGÚRESE DE QUE:   · Comprende estas instrucciones.  · Controlará su afección.  · Recibirá ayuda de inmediato si no mejora o si empeora.     Esta información no tiene como fin reemplazar el consejo del médico. Asegúrese de hacerle al médico cualquier pregunta que tenga.     Document Released: 10/22/2010 Document Revised: 10/12/2012  Elsevier Interactive Patient Education ©2016 Elsevier Inc.

## 2015-09-07 NOTE — Progress Notes (Signed)
Philipp DeputyKim SHaw CNM in earlier to discuss d/c plan. Written and verbal d/c instructions given and understanding voiced

## 2015-09-07 NOTE — MAU Provider Note (Signed)
  History     CSN: [id number]  Arrival date and time: 09/06/15 2312   None     No chief complaint on file.  HPI Ms Linda Bond is a 20yo G3O7564G3P1011 @ 29.2wks by LMP who presents for eval of 'feeling baby was down too low when I laid down.' Denies leaking, bldg or ctx. Reports +FM. She is not having the same sensation now. [facility] and it has been essentially unremarkable.  OB History    Gravida Para Term Preterm AB TAB SAB Ectopic Multiple Living   3 1 1  1  1   1       History reviewed. No pertinent past medical history.  Past Surgical History  Procedure Laterality Date  . No past surgeries      History reviewed. No pertinent family history.  Social History  Substance Use Topics  . Smoking status: Never Smoker   . Smokeless tobacco: None  . Alcohol Use: No    Allergies: No Known Allergies  Prescriptions prior to admission  Medication Sig Dispense Refill Last Dose  . Prenat w/o A-FeCbGl-DSS-FA-DHA (CITRANATAL 90 DHA) 90-1 & 300 MG MISC Take 1 tablet by mouth daily. 30 each 6 Past Week at Unknown time  . Doxylamine-Pyridoxine 10-10 MG TBEC 2 PO qhs; may take 1po in am and 1po in afternoon prn nausea 120 tablet 3   . omeprazole (PRILOSEC) 20 MG capsule Take 1 capsule (20 mg total) by mouth daily. For acid reflux 30 capsule 3     ROS No there pertinents other than what is listed in HPI Physical Exam   Blood pressure 106/56, pulse 73, temperature 97.5 F (36.4 C), resp. rate 18, height 5\' 3"  (1.6 m), weight 95.727 kg (211 lb 0.6 oz), last menstrual period 02/14/2015, unknown if currently breastfeeding.  Physical Exam  Constitutional: She is oriented to person, place, and time. She appears well-developed.  HENT:  Head: Normocephalic.  Neck: Normal range of motion.  Cardiovascular: Normal rate.   Respiratory: Effort normal.  GI:  EFM 120s, +accels, no decels, approp for GA Toco: rare irritability  Genitourinary: Vagina normal.  Cx C/L  Musculoskeletal:  Normal range of motion.  Neurological: She is alert and oriented to person, place, and time.  Skin: Skin is warm and dry.  Psychiatric: She has a normal mood and affect. Her behavior is normal. Thought content normal.   Urinalysis    Component Value Date/Time   COLORURINE YELLOW 09/06/2015 2330   APPEARANCEUR HAZY* 09/06/2015 2330   LABSPEC 1.020 09/06/2015 2330   PHURINE 6.0 09/06/2015 2330   GLUCOSEU NEGATIVE 09/06/2015 2330   HGBUR TRACE* 09/06/2015 2330   BILIRUBINUR NEGATIVE 09/06/2015 2330   KETONESUR NEGATIVE 09/06/2015 2330   PROTEINUR NEGATIVE 09/06/2015 2330   UROBILINOGEN 1.0 09/28/2013 2231   NITRITE NEGATIVE 09/06/2015 2330   LEUKOCYTESUR MODERATE* 09/06/2015 2330   Micro: SE 0-5, few bacteria  MAU Course  Procedures  MDM Cx exam NST read UA ordered  Assessment and Plan  IUP@29 .2wks Pelvic pressure  D/C home with comfort measures- pt reassured F/U at next scheduled visit  Cam HaiSHAW, Jaelyne Deeg CNM 09/07/2015, 12:36 AM

## 2015-09-07 NOTE — Progress Notes (Signed)
Philipp DeputyKim SHaw CNM in to see pt

## 2015-10-17 LAB — OB RESULTS CONSOLE GBS: STREP GROUP B AG: POSITIVE

## 2015-10-17 LAB — OB RESULTS CONSOLE GC/CHLAMYDIA
CHLAMYDIA, DNA PROBE: NEGATIVE
GC PROBE AMP, GENITAL: NEGATIVE

## 2015-11-22 ENCOUNTER — Encounter (HOSPITAL_COMMUNITY): Payer: Self-pay | Admitting: *Deleted

## 2015-11-22 ENCOUNTER — Telehealth (HOSPITAL_COMMUNITY): Payer: Self-pay | Admitting: *Deleted

## 2015-11-22 NOTE — Telephone Encounter (Signed)
Preadmission screen  

## 2015-11-23 ENCOUNTER — Encounter (HOSPITAL_COMMUNITY): Payer: Self-pay

## 2015-11-23 ENCOUNTER — Inpatient Hospital Stay (HOSPITAL_COMMUNITY)
Admission: AD | Admit: 2015-11-23 | Discharge: 2015-11-23 | Disposition: A | Discharge status: Home / Self Care | Payer: Medicaid Other | Source: Ambulatory Visit | Attending: Family Medicine | Admitting: Family Medicine

## 2015-11-23 DIAGNOSIS — Z3A40 40 weeks gestation of pregnancy: Secondary | ICD-10-CM | POA: Diagnosis not present

## 2015-11-23 DIAGNOSIS — Z3493 Encounter for supervision of normal pregnancy, unspecified, third trimester: Secondary | ICD-10-CM | POA: Insufficient documentation

## 2015-11-23 NOTE — MAU Note (Signed)
Pain in her side, back and upper abdomen that started an hour ago. No bleeding or LOF

## 2015-11-23 NOTE — MAU Note (Signed)
Spoke to [employer] CNM about [employer]. Pt can go home with labor precautions.

## 2015-11-28 ENCOUNTER — Encounter (HOSPITAL_COMMUNITY): Payer: Self-pay

## 2015-11-28 ENCOUNTER — Inpatient Hospital Stay (HOSPITAL_COMMUNITY)
Admission: RE | Admit: 2015-11-28 | Discharge: 2015-11-29 | DRG: 775 | Disposition: A | Discharge status: Home / Self Care | Payer: Medicaid Other | Source: Ambulatory Visit | Attending: Family Medicine | Admitting: Family Medicine

## 2015-11-28 DIAGNOSIS — Z3A41 41 weeks gestation of pregnancy: Secondary | ICD-10-CM

## 2015-11-28 DIAGNOSIS — Z3490 Encounter for supervision of normal pregnancy, unspecified, unspecified trimester: Secondary | ICD-10-CM

## 2015-11-28 DIAGNOSIS — O480 Post-term pregnancy: Principal | ICD-10-CM | POA: Diagnosis present

## 2015-11-28 LAB — CBC
HCT: 31.6 % — ABNORMAL LOW (ref 36.0–46.0)
Hemoglobin: 10.4 g/dL — ABNORMAL LOW (ref 12.0–15.0)
MCH: 24.1 pg — ABNORMAL LOW (ref 26.0–34.0)
MCHC: 32.9 g/dL (ref 30.0–36.0)
MCV: 73.1 fL — ABNORMAL LOW (ref 78.0–100.0)
Platelets: 250 10*3/uL (ref 150–400)
RBC: 4.32 MIL/uL (ref 3.87–5.11)
RDW: 14.3 % (ref 11.5–15.5)
WBC: 11.1 10*3/uL — ABNORMAL HIGH (ref 4.0–10.5)

## 2015-11-28 LAB — RPR: RPR Ser Ql: NONREACTIVE

## 2015-11-28 LAB — TYPE AND SCREEN
ABO/RH(D): O POS
Antibody Screen: NEGATIVE

## 2015-11-28 MED ORDER — EPHEDRINE 5 MG/ML INJ
10.0000 mg | INTRAVENOUS | Status: DC | PRN
  Filled 2015-11-28: qty 4

## 2015-11-28 MED ORDER — IBUPROFEN 600 MG PO TABS
600.0000 mg | ORAL_TABLET | Freq: Four times a day (QID) | ORAL | Status: DC | PRN
  Administered 2015-11-28: 600 mg via ORAL
  Filled 2015-11-28: qty 1

## 2015-11-28 MED ORDER — OXYTOCIN BOLUS FROM INFUSION
500.0000 mL | Freq: Once | INTRAVENOUS | Status: AC
  Administered 2015-11-28: 500 mL via INTRAVENOUS

## 2015-11-28 MED ORDER — OXYTOCIN 40 UNITS IN LACTATED RINGERS INFUSION - SIMPLE MED
2.5000 [IU]/h | INTRAVENOUS | Status: DC
  Filled 2015-11-28: qty 1000

## 2015-11-28 MED ORDER — DEXTROSE 5 % IV SOLN
5.0000 10*6.[IU] | Freq: Once | INTRAVENOUS | Status: AC
  Administered 2015-11-28: 5 10*6.[IU] via INTRAVENOUS
  Filled 2015-11-28: qty 5

## 2015-11-28 MED ORDER — BENZOCAINE-MENTHOL 20-0.5 % EX AERO: 1.0000 "application " | INHALATION_SPRAY | CUTANEOUS | Status: DC | PRN

## 2015-11-28 MED ORDER — PHENYLEPHRINE 40 MCG/ML (10ML) SYRINGE FOR IV PUSH (FOR BLOOD PRESSURE SUPPORT)
80.0000 ug | PREFILLED_SYRINGE | INTRAVENOUS | Status: DC | PRN
  Filled 2015-11-28: qty 5
  Filled 2015-11-28: qty 10

## 2015-11-28 MED ORDER — LACTATED RINGERS IV SOLN
INTRAVENOUS | Status: DC
  Administered 2015-11-28 (×2): via INTRAVENOUS

## 2015-11-28 MED ORDER — FENTANYL 2.5 MCG/ML BUPIVACAINE 1/10 % EPIDURAL INFUSION (WH - ANES)
14.0000 mL/h | INTRAMUSCULAR | Status: DC | PRN
  Filled 2015-11-28: qty 125

## 2015-11-28 MED ORDER — ZOLPIDEM TARTRATE 5 MG PO TABS: 5.0000 mg | ORAL_TABLET | Freq: Every evening | ORAL | Status: DC | PRN

## 2015-11-28 MED ORDER — WITCH HAZEL-GLYCERIN EX PADS: 1.0000 "application " | MEDICATED_PAD | CUTANEOUS | Status: DC | PRN

## 2015-11-28 MED ORDER — DIPHENHYDRAMINE HCL 50 MG/ML IJ SOLN: 12.5000 mg | INTRAMUSCULAR | Status: DC | PRN

## 2015-11-28 MED ORDER — DEXTROSE 5 % IV SOLN
2.5000 10*6.[IU] | INTRAVENOUS | Status: DC
  Filled 2015-11-28 (×4): qty 2.5

## 2015-11-28 MED ORDER — PHENYLEPHRINE 40 MCG/ML (10ML) SYRINGE FOR IV PUSH (FOR BLOOD PRESSURE SUPPORT)
80.0000 ug | PREFILLED_SYRINGE | INTRAVENOUS | Status: DC | PRN
  Filled 2015-11-28: qty 5

## 2015-11-28 MED ORDER — LIDOCAINE HCL (PF) 1 % IJ SOLN
30.0000 mL | INTRAMUSCULAR | Status: DC | PRN
  Filled 2015-11-28: qty 30

## 2015-11-28 MED ORDER — ACETAMINOPHEN 325 MG PO TABS: 650.0000 mg | ORAL_TABLET | ORAL | Status: DC | PRN

## 2015-11-28 MED ORDER — ONDANSETRON HCL 4 MG PO TABS
4.0000 mg | ORAL_TABLET | ORAL | Status: DC | PRN
Start: 2015-11-28 — End: 2015-11-29

## 2015-11-28 MED ORDER — PRENATAL MULTIVITAMIN CH
1.0000 | ORAL_TABLET | Freq: Every day | ORAL | Status: DC
  Administered 2015-11-29: 1 via ORAL
  Filled 2015-11-28: qty 1

## 2015-11-28 MED ORDER — TERBUTALINE SULFATE 1 MG/ML IJ SOLN
0.2500 mg | Freq: Once | INTRAMUSCULAR | Status: DC | PRN
  Filled 2015-11-28: qty 1

## 2015-11-28 MED ORDER — ONDANSETRON HCL 4 MG/2ML IJ SOLN: 4.0000 mg | INTRAMUSCULAR | Status: DC | PRN

## 2015-11-28 MED ORDER — COCONUT OIL OIL
1.0000 | TOPICAL_OIL | Status: DC | PRN
Start: 2015-11-28 — End: 2015-11-29

## 2015-11-28 MED ORDER — TETANUS-DIPHTH-ACELL PERTUSSIS 5-2.5-18.5 LF-MCG/0.5 IM SUSP: 0.5000 mL | Freq: Once | INTRAMUSCULAR | Status: DC

## 2015-11-28 MED ORDER — SENNOSIDES-DOCUSATE SODIUM 8.6-50 MG PO TABS
2.0000 | ORAL_TABLET | ORAL | Status: DC
  Filled 2015-11-28: qty 2

## 2015-11-28 MED ORDER — DIPHENHYDRAMINE HCL 25 MG PO CAPS: 25.0000 mg | ORAL_CAPSULE | Freq: Four times a day (QID) | ORAL | Status: DC | PRN

## 2015-11-28 MED ORDER — MISOPROSTOL 25 MCG QUARTER TABLET
25.0000 ug | ORAL_TABLET | ORAL | Status: DC
  Administered 2015-11-28: 25 ug via VAGINAL
  Filled 2015-11-28 (×5): qty 1
  Filled 2015-11-28: qty 0.25

## 2015-11-28 MED ORDER — LACTATED RINGERS IV SOLN: 500.0000 mL | Freq: Once | INTRAVENOUS | Status: DC

## 2015-11-28 MED ORDER — SOD CITRATE-CITRIC ACID 500-334 MG/5ML PO SOLN: 30.0000 mL | ORAL | Status: DC | PRN

## 2015-11-28 MED ORDER — DIBUCAINE 1 % RE OINT: 1.0000 "application " | TOPICAL_OINTMENT | RECTAL | Status: DC | PRN

## 2015-11-28 MED ORDER — IBUPROFEN 600 MG PO TABS
600.0000 mg | ORAL_TABLET | Freq: Four times a day (QID) | ORAL | Status: DC
  Administered 2015-11-28 – 2015-11-29 (×3): 600 mg via ORAL
  Filled 2015-11-28 (×3): qty 1

## 2015-11-28 MED ORDER — ONDANSETRON HCL 4 MG/2ML IJ SOLN: 4.0000 mg | Freq: Four times a day (QID) | INTRAMUSCULAR | Status: DC | PRN

## 2015-11-28 MED ORDER — LACTATED RINGERS IV SOLN
500.0000 mL | INTRAVENOUS | Status: DC | PRN
  Administered 2015-11-28: 1000 mL via INTRAVENOUS

## 2015-11-28 MED ORDER — SIMETHICONE 80 MG PO CHEW: 80.0000 mg | CHEWABLE_TABLET | ORAL | Status: DC | PRN

## 2015-11-28 NOTE — Lactation Note (Signed)
This note was copied from a baby's chart. Lactation Consultation Note  Patient Name: Girl Micheline ChapmanDaisy Baruch RUEAV'WToday's Date: 11/28/2015 Reason for consult: Initial assessment Baby at 2 hr of life. Mom stated she wants to bf and formula feed. She stated baby latched well after birth. Visitors began to arrive and mom was no longer listening to lactation. Left handouts on the side table and instructed mom to call for lactation at the next feeding.   Maternal Data    Feeding Feeding Type: Breast Fed Length of feed: 10 min  LATCH Score/Interventions                      Lactation Tools Discussed/Used     Consult Status Consult Status: Follow-up Date: 11/29/15    Rulon Eisenmengerlizabeth E Kaylan Yates 11/28/2015, 7:08 PM

## 2015-11-28 NOTE — Anesthesia Pain Management Evaluation Note (Signed)
  CRNA Pain Management Visit Note  Patient: Linda GrahamDaisy J Bond, 20 y.o., female  "Hello I am a member of the anesthesia team at [facility]. We have an anesthesia team available at all times to provide care throughout the hospital, including epidural management and anesthesia for C-section. I don't know your plan for the delivery whether it a natural birth, water birth, IV sedation, nitrous supplementation, doula or epidural, but we want to meet your pain goals."   1.Was your pain managed to your expectations on prior hospitalizations?   No   2.What is your expectation for pain management during this hospitalization?     Epidural  3.How can we help you reach that goal? Epidural when I am in labor.  Record the patient's initial score and the patient's pain goal.   Pain: 0  Pain Goal: 4 [facility] wants you to be able to say your pain was always managed very well.  Dixie Coppa 11/28/2015

## 2015-11-28 NOTE — H&P (Signed)
Linda Bond is a 20 y.o. female presenting for induction of labor for post dates . OB History    Gravida Para Term Preterm AB Living   3 1 1   1 1    SAB TAB Ectopic Multiple Live Births   1       1     History reviewed. No pertinent past medical history. Past Surgical History:  Procedure Laterality Date  . NO PAST SURGERIES     Family History: family history is not on file. Social History:  reports that she has never smoked. She has never used smokeless tobacco. She reports that she does not drink alcohol or use drugs.     Maternal Diabetes: No Genetic Screening: Normal Maternal Ultrasounds/Referrals: Abnormal:  Findings:   Isolated choroid plexus cyst Fetal Ultrasounds or other Referrals:  None Maternal Substance Abuse:  No Significant Maternal Medications:  None Significant Maternal Lab Results:  None Other Comments:  None  Review of Systems  Constitutional: Negative.   HENT: Negative.   Eyes: Negative.   Respiratory: Negative.   Cardiovascular: Negative.   Genitourinary: Negative.   Musculoskeletal: Negative.   Skin: Negative.   Neurological: Negative.   Psychiatric/Behavioral: Negative.    History   Blood pressure 119/84, pulse (!) 116, temperature 98 F (36.7 C), temperature source Oral, resp. rate 16, height 5\' 3"  (1.6 m), weight 214 lb (97.1 kg), last menstrual period 02/14/2015, unknown if currently breastfeeding. Maternal Exam:  Introitus: Normal vulva. Normal vagina.  Pelvis: adequate for delivery.      Physical Exam  Constitutional: She is oriented to person, place, and time. She appears well-developed.  HENT:  Head: Normocephalic.  Eyes: Pupils are equal, round, and reactive to light.  Neck: Normal range of motion.  Cardiovascular: Normal rate and regular rhythm.   Respiratory: Effort normal.  GI: Soft.  Musculoskeletal: Normal range of motion.  Neurological: She is oriented to person, place, and time.   Cervix is closed and posterior.    Prenatal labs: ABO, Rh: O/Positive/-- (03/20 0000) Antibody: Negative (03/20 0000) Rubella: Immune (03/20 0000) RPR: Nonreactive (03/20 0000)  HBsAg: Negative (03/20 0000)  HIV: Non-reactive (03/20 0000)  GBS: Positive (08/24 0000)   Assessment/Plan: Admit for post dates induction of labor. Begin with 25 mcg of cytotec; plan to transition to foley bulb and pitocin. Expect NSVD.    Charlesetta GaribaldiKathryn Lorraine Kooistra 11/28/2015, 8:56 AM

## 2015-11-29 ENCOUNTER — Encounter (HOSPITAL_COMMUNITY): Payer: Self-pay | Admitting: *Deleted

## 2015-11-29 ENCOUNTER — Ambulatory Visit: Payer: Self-pay

## 2015-11-29 LAB — BIRTH TISSUE RECOVERY COLLECTION (PLACENTA DONATION)

## 2015-11-29 MED ORDER — IBUPROFEN 600 MG PO TABS: 600.0000 mg | ORAL_TABLET | Freq: Four times a day (QID) | ORAL | 0 refills | Status: DC | PRN

## 2015-11-29 NOTE — Discharge Summary (Signed)
OB Discharge Summary     Patient Name: Linda GrahamDaisy J Bond DOB: Jul 19, 1995 MRN: [id number]  Date of admission: 11/28/2015 Delivering MD: Lorne SkeensSCHENK, NICHOLAS MICHAEL   Date of discharge: 11/29/2015  Admitting diagnosis: 41wks, induction Intrauterine pregnancy: 7768w1d     Secondary diagnosis:  Active Problems: Postpartum vaginal delivery   Additional problems: None     Discharge diagnosis: Term Pregnancy Delivered                                                                                                Post partum procedures:None  Augmentation: Pitocin and Cytotec  Complications: None  Hospital course:  Induction of Labor With Vaginal Delivery   20 y.o. yo G3P1011 at 2868w1d was admitted to the hospital 11/28/2015 for induction of labor.  Indication for induction: Postdates.  Patient had an uncomplicated labor course as follows: Membrane Rupture Time/Date: 4:52 PM ,11/28/2015   Intrapartum Procedures: Episiotomy: None [1]                                         Lacerations:  None [1]  Patient had delivery of a Viable infant.  Information for the patient's newborn:  Lavone OrnRea, Girl Crista [[id number]][[id number]]  Delivery Method: Vaginal, Spontaneous Delivery (Filed from Delivery Summary)   11/28/2015  Details of delivery can be found in separate delivery note.  Patient had a routine postpartum course. Patient is discharged home 11/29/15. requesting D/C at 24 hours PP. Received adequate GSB prophylaxis.   Physical exam Vitals:   11/28/15 1815 11/28/15 1907 11/28/15 2229 11/29/15 0613  BP: (!) 121/56 120/67 112/68 118/78  Pulse: 66 64 69 (!) 59  Resp: 18 18 18 18   Temp: 98.2 F (36.8 C) 97.7 F (36.5 C) 98 F (36.7 C) 98.1 F (36.7 C)  TempSrc: Oral Oral Oral   Weight:      Height:       General: alert, cooperative and no distress Lochia: appropriate Uterine Fundus: firm Incision: N/A DVT Evaluation: Negative Homan's sign. Labs: Lab Results  Component Value Date   WBC 11.1 (H) 11/28/2015   HGB 10.4 (L) 11/28/2015   HCT 31.6 (L) 11/28/2015   MCV 73.1 (L) 11/28/2015   PLT 250 11/28/2015   CMP Latest Ref Rng & Units 09/28/2013  Glucose 70 - 99 mg/dL 92  BUN 6 - 23 mg/dL 21  Creatinine 7.820.50 - 9.561.10 mg/dL 2.130.56  Sodium 086137 - 578147 mEq/L 142  Potassium 3.7 - 5.3 mEq/L 3.6(L)  Chloride 96 - 112 mEq/L 103  CO2 19 - 32 mEq/L 23  Calcium 8.4 - 10.5 mg/dL 9.7  Total Protein 6.0 - 8.3 g/dL 4.6(N8.5(H)  Total Bilirubin 0.3 - 1.2 mg/dL 0.4  Alkaline Phos 39 - 117 U/L 123(H)  AST 0 - 37 U/L 13  ALT 0 - 35 U/L 8    Discharge instruction: per After Visit Summary and "Baby and Me Booklet".  After visit meds:    Medication List    STOP taking these medications  Doxylamine-Pyridoxine 10-10 MG Tbec   omeprazole 20 MG capsule Commonly known as:  PRILOSEC     TAKE these medications   CITRANATAL 90 DHA 90-1 & 300 MG Misc Take 1 tablet by mouth daily.   ibuprofen 600 MG tablet Commonly known as:  ADVIL,MOTRIN Take 1 tablet (600 mg total) by mouth every 6 (six) hours as needed for moderate pain.       Diet: routine diet  Activity: Advance as tolerated. Pelvic rest for 6 weeks.   Outpatient follow up:6 weeks Follow up Appt:No future appointments. Follow up Visit:No Follow-up on file.  Postpartum contraception: Progesterone only pills  Newborn Data: Live born female  Birth Weight: 7 lb 5.3 oz (3325 g) APGAR: 8,   Baby Feeding: Breast Disposition:home with mother   11/29/2015 Dorathy Kinsman, CNM

## 2015-11-29 NOTE — Progress Notes (Signed)
UR chart review completed.  

## 2015-11-29 NOTE — Discharge Instructions (Signed)
Vaginal Delivery, Care After °Refer to this sheet in the next few weeks. These discharge instructions provide you with information on caring for yourself after delivery. Your health care provider may also give you specific instructions. Your treatment has been planned according to the most current medical practices available, but problems sometimes occur. Call your health care provider if you have any problems or questions after you go home. °HOME CARE INSTRUCTIONS °· Take over-the-counter or prescription medicines only as directed by your health care provider or pharmacist. °· Do not drink alcohol, especially if you are breastfeeding or taking medicine to relieve pain. °· Do not chew or smoke tobacco. °· Do not use illegal drugs. °· Continue to use good perineal care. Good perineal care includes: °¨ Wiping your perineum from front to back. °¨ Keeping your perineum clean. °· Do not use tampons or douche until your health care provider says it is okay. °· Shower, wash your hair, and take tub baths as directed by your health care provider. °· Wear a well-fitting bra that provides breast support. °· Eat healthy foods. °· Drink enough fluids to keep your urine clear or pale yellow. °· Eat high-fiber foods such as whole grain cereals and breads, brown rice, beans, and fresh fruits and vegetables every day. These foods may help prevent or relieve constipation. °· Follow your health care provider's recommendations regarding resumption of activities such as climbing stairs, driving, lifting, exercising, or traveling. °· Talk to your health care provider about resuming sexual activities. Resumption of sexual activities is dependent upon your risk of infection, your rate of healing, and your comfort and desire to resume sexual activity. °· Try to have someone help you with your household activities and your newborn for at least a few days after you leave the hospital. °· Rest as much as possible. Try to rest or take a nap  when your newborn is sleeping. °· Increase your activities gradually. °· Keep all of your scheduled postpartum appointments. It is very important to keep your scheduled follow-up appointments. At these appointments, your health care provider will be checking to make sure that you are healing physically and emotionally. °SEEK MEDICAL CARE IF:  °· You are passing large clots from your vagina. Save any clots to show your health care provider. °· You have a foul smelling discharge from your vagina. °· You have trouble urinating. °· You are urinating frequently. °· You have pain when you urinate. °· You have a change in your bowel movements. °· You have increasing redness, pain, or swelling near your vaginal incision (episiotomy) or vaginal tear. °· You have pus draining from your episiotomy or vaginal tear. °· Your episiotomy or vaginal tear is separating. °· You have painful, hard, or reddened breasts. °· You have a severe headache. °· You have blurred vision or see spots. °· You feel sad or depressed. °· You have thoughts of hurting yourself or your newborn. °· You have questions about your care, the care of your newborn, or medicines. °· You are dizzy or light-headed. °· You have a rash. °· You have nausea or vomiting. °· You were breastfeeding and have not had a menstrual period within 12 weeks after you stopped breastfeeding. °· You are not breastfeeding and have not had a menstrual period by the 12th week after delivery. °· You have a fever. °SEEK IMMEDIATE MEDICAL CARE IF:  °· You have persistent pain. °· You have chest pain. °· You have shortness of breath. °· You faint. °· You   have leg pain. °· You have stomach pain. °· Your vaginal bleeding saturates two or more sanitary pads in 1 hour. °  °This information is not intended to replace advice given to you by your health care provider. Make sure you discuss any questions you have with your health care provider. °  °Document Released: 02/07/2000 Document Revised:  10/31/2014 Document Reviewed: 10/07/2011 °Elsevier Interactive Patient Education ©2016 [employer] ° °Breastfeeding Challenges and Solutions °Even though breastfeeding is natural, it can be challenging, especially in the first few weeks after childbirth. It is normal for problems to arise when starting to breastfeed your new baby, even if you have breastfed before. This document provides some solutions to the most common breastfeeding challenges.  °CHALLENGES AND SOLUTIONS °Challenge--Cracked or Sore Nipples °Cracked or sore nipples are commonly experienced by breastfeeding mothers. Cracked or sore nipples often are caused by inadequate latching (when your baby's mouth attaches to your breast to breastfeed). Soreness can also happen if your baby is not positioned properly at your breast. Although nipple cracking and soreness are common during the first week after birth, nipple pain is never normal. If you experience nipple cracking or soreness that lasts longer than 1 week or nipple pain, call your health care provider or lactation consultant.  °Solution °Ensure proper latching and positioning of your baby by following the steps below: °· Find a comfortable place to sit or lie down, with your neck and back well supported. °· Place a pillow or rolled up blanket under your baby to bring him or her to the level of your breast (if you are seated). °· Make sure that your baby's abdomen is facing your abdomen. °· Gently massage your breast. With your fingertips, massage from your chest wall toward your nipple in a circular motion. This encourages milk flow. You may need to continue this action during the feeding if your milk flows slowly. °· Support your breast with 4 fingers underneath and your thumb above your nipple. Make sure your fingers are well away from your nipple and your baby's mouth. °· Stroke your baby's lips gently with your finger or nipple. °· When your baby's mouth is open wide enough, quickly bring  your baby to your breast, placing your entire nipple and as much of the colored area around your nipple (areola) as possible into your baby's mouth. °¨ More areola should be visible above your baby's upper lip than below the lower lip. °¨ Your baby's tongue should be between his or her lower gum and your breast. °· Ensure that your baby's mouth is correctly positioned around your nipple (latched). Your baby's lips should create a seal on your breast and be turned out (everted). °· It is common for your baby to suck for about 2-3 minutes in order to start the flow of breast milk. °Signs that your baby has successfully latched on to your nipple include:  °· Quietly tugging or quietly sucking without causing you pain.   °· Swallowing heard between every 3-4 sucks.   °· Muscle movement above and in front of his or her ears with sucking.   °Signs that your baby has not successfully latched on to nipple include:  °· Sucking sounds or smacking sounds from your baby while nursing.   °· Nipple pain.   °Ensure that your breasts stay moisturized and healthy by: °· Avoiding the use of soap on your nipples.   °· Wearing a supportive bra. Avoid wearing underwire-style bras or tight bras.   °· Air drying your nipples for 3-4 minutes   after each feeding.   °· Using only cotton bra pads to absorb breast milk leakage. Leaking of breast milk between feedings is normal.  Be sure to change the pads if they become soaked with milk. °· Using lanolin on your nipples after nursing. Lanolin helps to maintain your skin's normal moisture barrier. If you use pure lanolin you do not need to wash it off before feeding your baby again. Pure lanolin is not toxic to your baby.  You may also hand express a few drops of breast milk and gently massage that milk into your nipples, allowing it to air dry. °Challenge--Breast Engorgement °Breast engorgement is the overfilling of your breasts with breast milk. In the first few weeks after giving birth, you  may experience breast engorgement. Breast engorgement can make your breasts throb and feel hard, tightly stretched, warm, and tender. Engorgement peaks about the fifth day after you give birth. Having breast engorgement does not mean you have to stop breastfeeding your baby. °Solution °· Breastfeed when you feel the need to reduce the fullness of your breasts or when your baby shows signs of hunger. This is called "breastfeeding on demand." °· Newborns (babies younger than 4 weeks) often breastfeed every 1-3 hours during the day. You may need to awaken your baby to feed if he or she is asleep at a feeding time. °· Do not allow your baby to sleep longer than 5 hours during the night without a feeding. °· Pump or hand express breast milk before breastfeeding to soften your breast, areola, and nipple. °· Apply warm, moist heat (in the shower or with warm water-soaked hand towels) just before feeding or pumping, or massage your breast before or during breastfeeding. This increases circulation and helps your milk to flow. °· Completely empty your breasts when breastfeeding or pumping. Afterward, wear a snug bra (nursing or regular) or tank top for 1-2 days to signal your body to slightly decrease milk production. Only wear snug bras or tank tops to treat engorgement. Tight bras typically should be avoided by breastfeeding mothers. Once engorgement is relieved, return to wearing regular, loose-fitting clothes. °· Apply ice packs to your breasts to lessen the pain from engorgement and relieve swelling, unless the ice is uncomfortable for you. °· Do not delay feedings. Try to relax when it is time to feed your baby. This helps to trigger your "let-down reflex," which releases milk from your breast. °· Ensure your baby is latched on to your breast and positioned properly while breastfeeding. °· Allow your baby to remain at your breast as long as he or she is latched on well and actively sucking. Your baby will let you know  when he or she is done breastfeeding by pulling away from your breast or falling asleep. °· Avoid introducing bottles or pacifiers to your baby in the early weeks of breastfeeding. Wait to introduce these things until after resolving any breastfeeding challenges. °· Try to pump your milk on the same schedule as when your baby would breastfeed if you are returning to work or away from home for an extended period. °· Drink plenty of fluids to avoid dehydration, which can eventually put you at greater risk of breast engorgement. °If you follow these suggestions, your engorgement should improve in 24-48 hours. If you are still experiencing difficulty, call your lactation consultant or health care provider.  °Challenge--Plugged Milk Ducts °Plugged milk ducts occur when the duct does not drain milk effectively and becomes swollen. Wearing a tight-fitting nursing bra or having   difficulty with latching may cause plugged milk ducts. Not drinking enough water (8-10 c [1.9-2.4 L] per day) can contribute to plugged milk ducts. Once a duct has become plugged, hard lumps, soreness, and redness may develop in your breast.  °Solution °Do not delay feedings. Feed your baby frequently and try to empty your breasts of milk at each feeding. Try breastfeeding from the affected side first so there is a better chance that the milk will drain completely from that breast. Apply warm, moist towels to your breasts for 5-10 minutes before feeding. Alternatively, a hot shower right before breastfeeding can provide the moist heat that can encourage milk flow. Gentle massage of the sore area before and during a feeding may also help. Avoid wearing tight clothing or bras that put pressure on your breasts. Wear bras that offer good support to your breasts, but avoid underwire bras. If you have a plugged milk duct and develop a fever, you need to see your health care provider.  °Challenge--Mastitis °Mastitis is inflammation of your breast. It  usually is caused by a bacterial infection and can cause flu-like symptoms. You may develop redness in your breast and a fever. Often when mastitis occurs, your breast becomes firm, warm, and very painful. The most common causes of mastitis are poor latching, ineffective sucking from your baby, consistent pressure on your breast (possibly from wearing a tight-fitting bra or shirt that restricts the milk flow), unusual stress or fatigue, or missed feedings.  °Solution °You will be given antibiotic medicine to treat the infection. It is still important to breastfeed frequently to empty your breasts. Continuing to breastfeed while you recover from mastitis will not harm your baby. Make sure your baby is positioned properly during every feeding. Apply moist heat to your breasts for a few minutes before feeding to help the milk flow and to help your breasts empty more easily. °Challenge--Thrush °Thrush is a yeast infection that can form on your nipples, in your breast, or in your baby's mouth. It causes itching, soreness, burning or stabbing pain, and sometimes a rash.  °Solution °You will be given a medicated ointment for your nipples, and your baby will be given a liquid medicine for his or her mouth. It is important that you and your baby are treated at the same time because thrush can be passed between you and your baby. Change disposable nursing pads often. Any bras, towels, or clothing that come in contact with infected areas of your body or your baby's body need to be washed in very hot water every day. Wash your hands and your baby's hands often. All pacifiers, bottle nipples, or toys your baby puts in his or her mouth should be boiled once a day for 20 minutes. After 1 week of treatment, discard pacifiers and bottle nipples and buy new ones. All breast pump parts that touch the milk need to be boiled for 20 minutes every day. °Challenge--Low Milk Supply °You may not be producing enough milk if your baby is not  gaining the proper amount of weight. Breast milk production is based on a supply-and-demand system. Your milk supply depends on how frequently and effectively your baby empties your breast.  °Solution °The more you breastfeed and pump, the more breast milk you will produce. It is important that your baby empties at least one of your breasts at each feeding. If this is not happening, then use a breast pump or hand express any milk that remains. This will help to drain as   much milk as possible at each feeding. It will also signal your body to produce more milk. If your baby is not emptying your breasts, it may be due to latching, sucking, or positioning problems. If low milk supply continues after addressing these issues, contact your health care provider or a lactation specialist as soon as possible. °Challenge--Inverted or Flat Nipples °Some women have nipples that turn inward instead of protruding outward. Other women have nipples that are flat. Inverted or flat nipples can sometimes make it more difficult for your baby to latch onto your breast. °Solution °You may be given a small device that pulls out inverted nipples. This device should be applied right before your baby is brought to your breast. You can also try using a breast pump for a short time before placing the baby at your breast. The pump can pull your nipple outwards to help your infant latch more easily. The baby's sucking motion will help the inverted nipple protrude as well.  °If you have flat nipples, encourage your baby to latch onto your breast and feed frequently in the early days after birth. This will give your baby practice latching on correctly while your breast is still soft. When your milk supply increases, between the second and fifth day after birth and your breasts become full, your baby will have an easier time latching.  °Contact a lactation consultant if you still have concerns. She or he can teach you additional techniques to  address breastfeeding problems related to nipple shape and position.  °FOR MORE INFORMATION °[employer]: www.llli.org °  °This information is not intended to replace advice given to you by your health care provider. Make sure you discuss any questions you have with your health care provider. °  °Document Released: 08/03/2005 Document Revised: 03/02/2014 Document Reviewed: 08/05/2012 °Elsevier Interactive Patient Education ©2016 [employer] Inc. ° °

## 2015-11-29 NOTE — Lactation Note (Signed)
This note was copied from a baby's chart. Lactation Consultation Note Follow up visit at 29 hours of age. Baby has had 8 feeding with 5 voids and 2 stools.  Mom reports a recent feeding of about 25 minutes and baby is still showing feeding cues and eager to feed.  Mom is tired and concerned about how well baby is feeding.  Lc offered to assist with latching several times before mom agreed.  Mom reports she did not have anyone show her how to do hand expression so LC instructed mom with colostrum visible. Mom attempted latch in cradle hold and reports some pain.  LC assisted with relatching in cross cradle hold for more control over latch.  Baby tucks upper lip, needing to be rolled our with each latch.  Wide gape and some audible swallows during feeding. Mom reports improved comfort.  Baby relaxed and before baby released nipple Mom had to stop feeding to use the bathroom.  Lc burped baby and changed dirty diaper.  Baby is fussy on and off, LC encouraged mom to comfort baby and then offer breast again and discussed cluster feeding. Lc discussed at length formula/ bottle feeding and encouraged mom to continue to offer breast.  LC advised mom could try hand expression and spoon feeding as needed. If mom requests formula/bottle Rn should give to mom as she has been educated thoroughly.     Patient Name: Linda Bond ZOXWR'UToday's Date: 11/29/2015 Reason for consult: Follow-up assessment   Maternal Data Has patient been taught Hand Expression?: Yes Does the patient have breastfeeding experience prior to this delivery?: Yes  Feeding Feeding Type: Breast Fed Length of feed: 10 min  LATCH Score/Interventions Latch: Repeated attempts needed to sustain latch, nipple held in mouth throughout feeding, stimulation needed to elicit sucking reflex. Intervention(s): Adjust position;Assist with latch;Breast massage;Breast compression  Audible Swallowing: A few with stimulation Intervention(s): Skin to skin;Hand  expression  Type of Nipple: Everted at rest and after stimulation  Comfort (Breast/Nipple): Soft / non-tender     Hold (Positioning): Assistance needed to correctly position infant at breast and maintain latch. Intervention(s): Breastfeeding basics reviewed;Support Pillows;Position options;Skin to skin  LATCH Score: 7  Lactation Tools Discussed/Used     Consult Status Consult Status: Follow-up Date: 11/30/15 Follow-up type: In-patient    Jannifer RodneyShoptaw, Auston Halfmann Lynn 11/29/2015, 10:48 PM

## 2015-11-30 ENCOUNTER — Ambulatory Visit: Payer: Self-pay

## 2015-11-30 NOTE — Lactation Note (Signed)
This note was copied from a baby's chart. Lactation Consultation Note  Recommend offering breast before formula to help establish her milk supply. Mom encouraged to feed baby 8-12 times/24 hours and with feeding cues.  Reviewed engorgement care and monitoring voids/stools.    Patient Name: [name] ZOXWR'UToday's Date: 11/30/2015 Reason for consult: Follow-up assessment   Maternal Data    Feeding Feeding Type: Bottle Fed - Formula Nipple Type: Slow - flow  LATCH Score/Interventions                      Lactation Tools Discussed/Used     Consult Status Consult Status: Complete    Hardie PulleyBerkelhammer, Chrystie Hagwood Boschen 11/30/2015, 11:45 AM

## 2015-12-05 ENCOUNTER — Encounter (HOSPITAL_COMMUNITY): Payer: Self-pay

## 2017-05-22 ENCOUNTER — Encounter (HOSPITAL_COMMUNITY): Payer: Self-pay | Admitting: *Deleted

## 2017-05-22 ENCOUNTER — Inpatient Hospital Stay (HOSPITAL_COMMUNITY)
Admission: AD | Admit: 2017-05-22 | Discharge: 2017-05-22 | Disposition: A | Discharge status: Home / Self Care | Payer: Medicaid Other | Source: Ambulatory Visit | Attending: Obstetrics & Gynecology | Admitting: Obstetrics & Gynecology

## 2017-05-22 ENCOUNTER — Inpatient Hospital Stay (HOSPITAL_COMMUNITY): Payer: Medicaid Other

## 2017-05-22 DIAGNOSIS — O209 Hemorrhage in early pregnancy, unspecified: Secondary | ICD-10-CM

## 2017-05-22 DIAGNOSIS — O034 Incomplete spontaneous abortion without complication: Secondary | ICD-10-CM

## 2017-05-22 DIAGNOSIS — Z3A12 12 weeks gestation of pregnancy: Secondary | ICD-10-CM | POA: Insufficient documentation

## 2017-05-22 LAB — WET PREP, GENITAL
CLUE CELLS WET PREP: NONE SEEN
Sperm: NONE SEEN
Trich, Wet Prep: NONE SEEN
Yeast Wet Prep HPF POC: NONE SEEN

## 2017-05-22 MED ORDER — OXYCODONE-ACETAMINOPHEN 5-325 MG PO TABS: 2.0000 | ORAL_TABLET | ORAL | 0 refills | Status: DC | PRN

## 2017-05-22 NOTE — Discharge Instructions (Signed)
Miscarriage A miscarriage is the loss of an unborn baby (fetus) before the 20th week of pregnancy. The cause is often unknown. Follow these instructions at home:  You may need to stay in bed (bed rest), or you may be able to do light activity. Go about activity as told by your doctor.  Have help at home.  Write down how many pads you use each day. Write down how soaked they are.  Do not use tampons. Do not wash out your vagina (douche) or have sex (intercourse) until your doctor approves.  Only take medicine as told by your doctor.  Do not take aspirin.  Keep all doctor visits as told.  If you or your partner have problems with grieving, talk to your doctor. You can also try counseling. Give yourself time to grieve before trying to get pregnant again. Get help right away if:  You have bad cramps or pain in your back or belly (abdomen).  You have a fever.  You pass large clumps of blood (clots) from your vagina that are walnut-sized or larger. Save the clumps for your doctor to see.  You pass large amounts of tissue from your vagina. Save the tissue for your doctor to see.  You have more bleeding.  You have thick, bad-smelling fluid (discharge) coming from the vagina.  You get lightheaded, weak, or you pass out (faint).  You have chills. This information is not intended to replace advice given to you by your health care provider. Make sure you discuss any questions you have with your health care provider. Document Released: 05/04/2011 Document Revised: 07/18/2015 Document Reviewed: 03/12/2011 Elsevier Interactive Patient Education  2017 [employer] Vaginal Bleeding During Pregnancy, First Trimester A small amount of bleeding (spotting) from the vagina is common in early pregnancy. Sometimes the bleeding is normal and is not a problem, and sometimes it is a sign of something serious. Be sure to tell your doctor about any bleeding from your vagina right away. Follow these  instructions at home:  Watch your condition for any changes.  Follow your doctor's instructions about how active you can be.  If you are on bed rest: ? You may need to stay in bed and only get up to use the bathroom. ? You may be allowed to do some activities. ? If you need help, make plans for someone to help you.  Write down: ? The number of pads you use each day. ? How often you change pads. ? How soaked (saturated) your pads are.  Do not use tampons.  Do not douche.  Do not have sex or orgasms until your doctor says it is okay.  If you pass any tissue from your vagina, save the tissue so you can show it to your doctor.  Only take medicines as told by your doctor.  Do not take aspirin because it can make you bleed.  Keep all follow-up visits as told by your doctor. Contact a doctor if:  You bleed from your vagina.  You have cramps.  You have labor pains.  You have a fever that does not go away after you take medicine. Get help right away if:  You have very bad cramps in your back or belly (abdomen).  You pass large clots or tissue from your vagina.  You bleed more.  You feel light-headed or weak.  You pass out (faint).  You have chills.  You are leaking fluid or have a gush of fluid from your vagina.  You pass out while pooping (having a bowel movement). This information is not intended to replace advice given to you by your health care provider. Make sure you discuss any questions you have with your health care provider. Document Released: 06/26/2013 Document Revised: 07/18/2015 Document Reviewed: 10/17/2012 Elsevier Interactive Patient Education  [employer].

## 2017-05-22 NOTE — MAU Note (Signed)
Pt started bleeding around 1400 and has 3/4 abdominal cramping that started just before the bleeding.  Pt states there is an odor to the discharge as well.  Cultures sent.

## 2017-05-22 NOTE — MAU Provider Note (Addendum)
History   G3P2002 @ apx [redacted] wks gestation in with abd pain and vag bleeding that started 1 hour ago.  CSN: [id number]  Arrival date & time 05/22/17  1431   None     Chief Complaint  Patient presents with  . Vaginal Bleeding  . Abdominal Pain    HPI  No past medical history on file.  Past Surgical History:  Procedure Laterality Date  . NO PAST SURGERIES      No family history on file.  Social History   Tobacco Use  . Smoking status: Never Smoker  . Smokeless tobacco: Never Used  Substance Use Topics  . Alcohol use: No  . Drug use: No    OB History    Gravida  3   Para  2   Term  2   Preterm      AB  1   Living  2     SAB  1   TAB      Ectopic      Multiple      Live Births  2           Review of Systems  Constitutional: Negative.   HENT: Negative.   Eyes: Negative.   Respiratory: Negative.   Cardiovascular: Negative.   Gastrointestinal: Positive for abdominal pain.  Endocrine: Negative.   Genitourinary: Positive for vaginal bleeding.  Musculoskeletal: Negative.   Skin: Negative.   Allergic/Immunologic: Negative.   Neurological: Negative.   Hematological: Negative.   Psychiatric/Behavioral: Negative.     Allergies  Patient has no known allergies.  Home Medications    There were no vitals taken for this visit.  Physical Exam  Constitutional: She is oriented to person, place, and time. She appears well-developed and well-nourished.  HENT:  Head: Normocephalic.  Eyes: Pupils are equal, round, and reactive to light.  Neck: Normal range of motion.  Cardiovascular: Normal rate, regular rhythm, normal heart sounds and intact distal pulses.  Pulmonary/Chest: Effort normal and breath sounds normal.  Abdominal: Soft. Bowel sounds are normal.  Genitourinary: Vagina normal and uterus normal.  Genitourinary Comments: sm amt dark vaginal bleeding.  Musculoskeletal: Normal range of motion.  Neurological: She is alert and oriented to  person, place, and time. She has normal reflexes.  Skin: Skin is warm and dry.  Psychiatric: She has a normal mood and affect. Her behavior is normal. Judgment and thought content normal.    MAU Course  Procedures (including critical care time)  Labs Reviewed  WET PREP, GENITAL  URINALYSIS, ROUTINE W REFLEX MICROSCOPIC  GC/CHLAMYDIA PROBE AMP ([place]   No results found. Results for orders placed or performed during the hospital encounter of 05/22/17 (from the past 24 hour(s))  Wet prep, genital     Status: Abnormal   Collection Time: 05/22/17  2:53 PM  Result Value Ref Range   Yeast Wet Prep HPF POC NONE SEEN NONE SEEN   Trich, Wet Prep NONE SEEN NONE SEEN   Clue Cells Wet Prep HPF POC NONE SEEN NONE SEEN   WBC, Wet Prep HPF POC MANY (A) NONE SEEN   Sperm NONE SEEN     1. Vaginal bleeding in pregnancy, first trimester       MDM  VSS, wet prep . Cultures obtained. [facility]. Pt having sm amt dark vag bleeding with exam. Abd soft and non tender. US shows IUFD @ 7.5 wks. enghty discussion with pt treatment options.  She ops for expectant management. She will F/U in clinic in 2 wks. Dr. Macon Large consulted and pt to f/u in 2 wks or PRN. Message sent to clinic for pt appt in two weeks for follow up

## 2017-05-24 LAB — GC/CHLAMYDIA PROBE AMP (~~LOC~~) NOT AT ARMC
Chlamydia: NEGATIVE
Neisseria Gonorrhea: NEGATIVE

## 2017-06-30 ENCOUNTER — Encounter: Payer: Self-pay | Admitting: Nurse Practitioner

## 2017-06-30 ENCOUNTER — Ambulatory Visit (INDEPENDENT_AMBULATORY_CARE_PROVIDER_SITE_OTHER): Payer: Medicaid Other | Admitting: Nurse Practitioner

## 2017-06-30 VITALS — BP 120/80 | HR 81 | Ht 70.0 in | Wt 229.7 lb

## 2017-06-30 DIAGNOSIS — E669 Obesity, unspecified: Secondary | ICD-10-CM

## 2017-06-30 DIAGNOSIS — O039 Complete or unspecified spontaneous abortion without complication: Secondary | ICD-10-CM | POA: Diagnosis not present

## 2017-06-30 DIAGNOSIS — Z30011 Encounter for initial prescription of contraceptive pills: Secondary | ICD-10-CM

## 2017-06-30 LAB — POCT PREGNANCY, URINE: Preg Test, Ur: NEGATIVE

## 2017-06-30 MED ORDER — NORETHIN ACE-ETH ESTRAD-FE 1.5-30 MG-MCG PO TABS: 1.0000 | ORAL_TABLET | Freq: Every day | ORAL | 2 refills | Status: DC

## 2017-06-30 NOTE — Progress Notes (Signed)
   GYNECOLOGY OFFICE VISIT NOTE   History:  22 y.o. [id number] here today for follow up from a miscarriage.  Note reviewed from MAU visit.  She had heavy vaginal bleeding with clots for 2 days after the visit.  She is not bleeding now.  She was not trying to become pregnant and wants to start birth control today.  She denies any abnormal vaginal discharge, bleeding, pelvic pain or other concerns.   History reviewed. No pertinent past medical history.  Past Surgical History:  Procedure Laterality Date  . NO PAST SURGERIES      The following portions of the patient's history were reviewed and updated as appropriate: allergies, current medications, past family history, past medical history, past social history, past surgical history and problem list.    Review of Systems:  Pertinent items noted in HPI and remainder of comprehensive ROS otherwise negative.  Objective:  Physical Exam BP 120/80   Pulse 81   Ht  (1.778 m)   Wt 229 lb 11.2 oz (104.2 kg)   LMP  (LMP Unknown)   Breastfeeding? Unknown   BMI 32.96 kg/m  CONSTITUTIONAL: Well-developed, well-nourished female in no acute distress.  HENT:  Normocephalic, atraumatic. External right and left ear normal.  EYES: Conjunctivae and EOM are normal. No scleral icterus.  NECK: Normal range of motion, supple, no masses SKIN: Skin is warm and dry. No rash noted. Not diaphoretic. No erythema. No pallor. NEUROLOGIC: Alert and oriented to person, place, and time. Normal muscle tone coordination. No cranial nerve deficit noted. PSYCHIATRIC: Normal mood and affect. Normal behavior. Normal judgment and thought content. MUSCULOSKELETAL: Normal range of motion. No edema noted.  Labs and Imaging No results found.  Assessment & Plan:  1. Complete miscarriage No emotional distress.  Does not desire to be pregnant now.  2. Encounter for initial prescription of contraceptive pills Requesting pills for now.  Also considering Depo or Nexplanon  but has problems with weight gain and is unsure about those. Return for BP check and pill refill in 3 months.  3. Obesity (BMI 30.0-34.9) Healthy meals and increased exercise recommended  Reduce weight to a BMI of 24  Routine preventative health maintenance measures emphasized. Please refer to After Visit Summary for other counseling recommendations.   Return in about 3 months (around 09/30/2017).   Total face-to-face time with patient: 15 minutes.  Over 50% of encounter was spent on counseling and coordination of care.  Nolene Bernheim, RN, MSN, NP-BC Nurse Practitioner, [facility] for [employer], [facility] 06/30/2017 11:03 AM

## 2017-06-30 NOTE — Patient Instructions (Addendum)
Check on [email or web address]    Exercising to Lose Weight Exercising can help you to lose weight. In order to lose weight through exercise, you need to do vigorous-intensity exercise. You can tell that you are exercising with vigorous intensity if you are breathing very hard and fast and cannot hold a conversation while exercising. Moderate-intensity exercise helps to maintain your current weight. You can tell that you are exercising at a moderate level if you have a higher heart rate and faster breathing, but you are still able to hold a conversation. How often should I exercise? Choose an activity that you enjoy and set realistic goals. Your health care provider can help you to make an activity plan that works for you. Exercise regularly as directed by your health care provider. This may include:  Doing resistance training twice each week, such as: ? Push-ups. ? Sit-ups. ? Lifting weights. ? Using resistance bands.  Doing a given intensity of exercise for a given amount of time. Choose from these options: ? 150 minutes of moderate-intensity exercise every week. ? 75 minutes of vigorous-intensity exercise every week. ? A mix of moderate-intensity and vigorous-intensity exercise every week.  Children, pregnant women, people who are out of shape, people who are overweight, and older adults may need to consult a health care provider for individual recommendations. If you have any sort of medical condition, be sure to consult your health care provider before starting a new exercise program. What are some activities that can help me to lose weight?  Walking at a rate of at least 4.5 miles an hour.  Jogging or running at a rate of 5 miles per hour.  Biking at a rate of at least 10 miles per hour.  Lap swimming.  Roller-skating or in-line skating.  Cross-country skiing.  Vigorous competitive sports, such as football, basketball, and soccer.  Jumping rope.  Aerobic dancing. How can I be  more active in my day-to-day activities?  Use the stairs instead of the elevator.  Take a walk during your lunch break.  If you drive, park your car farther away from work or school.  If you take public transportation, get off one stop early and walk the rest of the way.  Make all of your phone calls while standing up and walking around.  Get up, stretch, and walk around every 30 minutes throughout the day. What guidelines should I follow while exercising?  Do not exercise so much that you hurt yourself, feel dizzy, or get very short of breath.  Consult your health care provider prior to starting a new exercise program.  Wear comfortable clothes and shoes with good support.  Drink plenty of water while you exercise to prevent dehydration or heat stroke. Body water is lost during exercise and must be replaced.  Work out until you breathe faster and your heart beats faster. This information is not intended to replace advice given to you by your health care provider. Make sure you discuss any questions you have with your health care provider. Document Released: 03/14/2010 Document Revised: 07/18/2015 Document Reviewed: 07/13/2013 Elsevier Interactive Patient Education  [employer].

## 2017-06-30 NOTE — Progress Notes (Signed)
Wants to discuss Birth Control, interested in the Pill or Nexplanon.

## 2018-02-02 ENCOUNTER — Ambulatory Visit (INDEPENDENT_AMBULATORY_CARE_PROVIDER_SITE_OTHER): Payer: Self-pay

## 2018-02-02 DIAGNOSIS — Z113 Encounter for screening for infections with a predominantly sexual mode of transmission: Secondary | ICD-10-CM

## 2018-02-02 DIAGNOSIS — R8290 Unspecified abnormal findings in urine: Secondary | ICD-10-CM

## 2018-02-02 DIAGNOSIS — B9689 Other specified bacterial agents as the cause of diseases classified elsewhere: Secondary | ICD-10-CM

## 2018-02-02 DIAGNOSIS — N898 Other specified noninflammatory disorders of vagina: Secondary | ICD-10-CM

## 2018-02-02 DIAGNOSIS — Z3202 Encounter for pregnancy test, result negative: Secondary | ICD-10-CM

## 2018-02-02 DIAGNOSIS — N760 Acute vaginitis: Secondary | ICD-10-CM

## 2018-02-02 LAB — POCT URINALYSIS DIP (DEVICE)
Bilirubin Urine: NEGATIVE
Glucose, UA: NEGATIVE mg/dL
Ketones, ur: NEGATIVE mg/dL
Nitrite: NEGATIVE
PH: 6.5 (ref 5.0–8.0)
Protein, ur: 30 mg/dL — AB
SPECIFIC GRAVITY, URINE: 1.025 (ref 1.005–1.030)
Urobilinogen, UA: 1 mg/dL (ref 0.0–1.0)

## 2018-02-02 NOTE — Progress Notes (Signed)
Pt here today with c/o vaginal discharge with a smell.  Pt reports that she may also have a UTI and that she wants to get a urine pregnancy test.  Pt explained how to obtain self-swab.  Pt also informed that we will send her urine off for culture, results will take at least 24-48 hours, and if anything comes back abnormal then we will give her a call.  Pt stated understanding.

## 2018-02-02 NOTE — Progress Notes (Signed)
I have reviewed the chart and agree with nursing staff's documentation of this patient's encounter.  Raelyn MoraRolitta Brasen Bundren, CNM 02/02/2018 7:03 PM

## 2018-02-03 LAB — URINE CULTURE: Organism ID, Bacteria: NO GROWTH

## 2018-02-03 LAB — POCT PREGNANCY, URINE: Preg Test, Ur: NEGATIVE

## 2018-02-04 LAB — CERVICOVAGINAL ANCILLARY ONLY
Bacterial vaginitis: POSITIVE — AB
Candida vaginitis: NEGATIVE
Chlamydia: NEGATIVE
NEISSERIA GONORRHEA: NEGATIVE
Trichomonas: NEGATIVE

## 2018-02-08 ENCOUNTER — Telehealth: Payer: Self-pay | Admitting: General Practice

## 2018-02-08 DIAGNOSIS — B9689 Other specified bacterial agents as the cause of diseases classified elsewhere: Secondary | ICD-10-CM

## 2018-02-08 DIAGNOSIS — N760 Acute vaginitis: Principal | ICD-10-CM

## 2018-02-08 MED ORDER — METRONIDAZOLE 500 MG PO TABS: 500.0000 mg | ORAL_TABLET | Freq: Two times a day (BID) | ORAL | 0 refills | Status: DC

## 2018-02-08 NOTE — Telephone Encounter (Signed)
-----   Message from Raelyn Moraolitta Dawson, [place] sent at 02/05/2018  8:19 AM EST ----- Please treat for BV. This patient doe not have an active My Chart account

## 2018-02-08 NOTE — Telephone Encounter (Signed)
Called patient & informed her of results and prescription sent to pharmacy with directions. Patient verbalized understanding & asked about UTI prescription. Told patient her urine culture came back negative so she doesn't have a bladder infection. Patient verbalized understanding & had no questions.

## 2018-02-10 ENCOUNTER — Telehealth: Payer: Self-pay | Admitting: *Deleted

## 2018-02-10 NOTE — Telephone Encounter (Signed)
Called to front desk and asked to speak to nurse; call transferred to nurse. She c/o starting taking flagyl on 12/17 for infection and now urine is light to dark brown; but is making usual amount of urine..  We discussed this is not a normal side effect of flagyl and I will call her back. Discussed with pharmacy and is not usual side effect. Discussed with [name] who reviewed chart and ua- per [name] patient urine concentrated- inform her to force water until urine clear-if brown color persists have her call back on Monday and refer to urologist.   I called Linda Bond and she admits she has not been drinking much at all; especially since started flagyl- maybe 1/2 water bottle daily. I advised her that she needs to drink a minimum of 8 glasses of water a day and [name] her to drink fluids until urine clear or pale.  If by Monday this is not helping to call us back and may refer to urologist. She states she doesn't drink water- but I informed her try drink some water and some non caffeine drinks, no sodas. She voices understanding.

## 2018-02-22 ENCOUNTER — Ambulatory Visit (HOSPITAL_COMMUNITY)
Admission: EM | Admit: 2018-02-22 | Discharge: 2018-02-22 | Disposition: A | Discharge status: Home / Self Care | Payer: Self-pay | Attending: Family Medicine | Admitting: Family Medicine

## 2018-02-22 ENCOUNTER — Encounter (HOSPITAL_COMMUNITY): Payer: Self-pay | Admitting: Emergency Medicine

## 2018-02-22 ENCOUNTER — Other Ambulatory Visit: Payer: Self-pay

## 2018-02-22 DIAGNOSIS — B9789 Other viral agents as the cause of diseases classified elsewhere: Secondary | ICD-10-CM | POA: Insufficient documentation

## 2018-02-22 DIAGNOSIS — J069 Acute upper respiratory infection, unspecified: Secondary | ICD-10-CM | POA: Insufficient documentation

## 2018-02-22 MED ORDER — BENZONATATE 100 MG PO CAPS: 100.0000 mg | ORAL_CAPSULE | Freq: Three times a day (TID) | ORAL | 0 refills | Status: DC

## 2018-02-22 MED ORDER — FLUTICASONE PROPIONATE 50 MCG/ACT NA SUSP: 2.0000 | Freq: Every day | NASAL | 0 refills | Status: AC

## 2018-02-22 MED ORDER — CETIRIZINE-PSEUDOEPHEDRINE ER 5-120 MG PO TB12: 1.0000 | ORAL_TABLET | Freq: Every day | ORAL | 0 refills | Status: DC

## 2018-02-22 NOTE — ED Triage Notes (Signed)
cough and itchy throat started 3 days ago

## 2018-02-22 NOTE — Discharge Instructions (Signed)
Get plenty of rest and push fluids Tessalon Perles prescribed for cough Zyrtec-D prescribed for nasal congestion, runny nose, and/or sore throat. DO NOT TAKE WHILE BREAST FEEDING Flonase prescribed for nasal congestion and runny nose Use medications daily for symptom relief Use OTC medications like ibuprofen or tylenol as needed fever or pain Follow up with PCP if symptoms persist Return or go to ER if you have any new or worsening symptoms fever, chills, nausea, vomiting, chest pain, cough, shortness of breath, wheezing, abdominal pain, changes in bowel or bladder habits, etc...Marland Kitchen

## 2018-02-22 NOTE — ED Provider Notes (Signed)
[facility]   [id number] 02/22/18 Arrival Time: 1456   CC: URI symptoms   SUBJECTIVE: History from: patient.  Clementeen GrahamDaisy J Messerschmidt is a 22 y.o. female who presents with dry cough and scratchy throat x 3 days.  Admits to positive sick exposure to children with similar symptoms.  Has NOT tried OTC medications.  Denies aggravating factors.  Reports previous symptoms in the past.   Denies fever, chills, fatigue, sinus pain, rhinorrhea, sore throat, SOB, wheezing, chest pain, nausea, changes in bowel or bladder habits.    Received flu shot this year: no.  ROS: As per HPI.  History reviewed. No pertinent past medical history. Past Surgical History:  Procedure Laterality Date  . NO PAST SURGERIES     No Known Allergies No current facility-administered medications on file prior to encounter.    No current outpatient medications on file prior to encounter.   Social History   Socioeconomic History  . Marital status: Married    Spouse name: Not on file  . Number of children: Not on file  . Years of education: Not on file  . Highest education level: Not on file  Occupational History  . Not on file  Social Needs  . Financial resource strain: Not on file  . Food insecurity:    Worry: Not on file    Inability: Not on file  . Transportation needs:    Medical: Not on file    Non-medical: Not on file  Tobacco Use  . Smoking status: Never Smoker  . Smokeless tobacco: Never Used  Substance and Sexual Activity  . Alcohol use: No  . Drug use: No  . Sexual activity: Yes    Birth control/protection: Condom  Lifestyle  . Physical activity:    Days per week: Not on file    Minutes per session: Not on file  . Stress: Not on file  Relationships  . Social connections:    Talks on phone: Not on file    Gets together: Not on file    Attends religious service: Not on file    Active member of club or organization: Not on file    Attends meetings of clubs or organizations: Not on  file    Relationship status: Not on file  . Intimate partner violence:    Fear of current or ex partner: Not on file    Emotionally abused: Not on file    Physically abused: Not on file    Forced sexual activity: Not on file  Other Topics Concern  . Not on file  Social History Narrative  . Not on file   Family History  Problem Relation Age of Onset  . Healthy Mother   . Healthy Father     OBJECTIVE:  Vitals:   02/22/18 1614  BP: 140/79  Pulse: (!) 113  Resp: 18  Temp: 98.2 F (36.8 C)  TempSrc: Oral  SpO2: 100%     General appearance: alert; appears mildly fatigued, but nontoxic; speaking in full sentences and tolerating own secretions HEENT: NCAT; Ears: EACs clear, TMs pearly gray; Eyes: PERRL.  EOM grossly intact. Nose: nares patent without rhinorrhea, Throat: oropharynx clear, tonsils non erythematous or enlarged, uvula midline  Neck: supple without LAD Lungs: unlabored respirations, symmetrical air entry; cough: absent; no respiratory distress; CTAB Heart: regular rate and rhythm.  Radial pulses 2+ symmetrical bilaterally Skin: warm and dry Psychological: alert and cooperative; normal mood and affect  ASSESSMENT & PLAN:  1. Viral URI with cough  Meds ordered this encounter  Medications  . cetirizine-pseudoephedrine (ZYRTEC-D) 5-120 MG tablet    Sig: Take 1 tablet by mouth daily.    Dispense:  30 tablet    Refill:  0    Order Specific Question:   Supervising Provider    Answer:   Eustace MooreNELSON, YVONNE SUE [[id number]]  . fluticasone (FLONASE) 50 MCG/ACT nasal spray    Sig: Place 2 sprays into both nostrils daily.    Dispense:  16 g    Refill:  0    Order Specific Question:   Supervising Provider    Answer:   Eustace MooreNELSON, YVONNE SUE [[id number]]  . benzonatate (TESSALON) 100 MG capsule    Sig: Take 1 capsule (100 mg total) by mouth every 8 (eight) hours.    Dispense:  21 capsule    Refill:  0    Order Specific Question:   Supervising Provider    Answer:   Eustace MooreELSON,  YVONNE SUE [[id number]]    Get plenty of rest and push fluids Tessalon Perles prescribed for cough Zyrtec-D prescribed for nasal congestion, runny nose, and/or sore throat. DO NOT TAKE WHILE BREAST FEEDING Flonase prescribed for nasal congestion and runny nose Use medications daily for symptom relief Use OTC medications like ibuprofen or tylenol as needed fever or pain Follow up with PCP if symptoms persist Return or go to ER if you have any new or worsening symptoms fever, chills, nausea, vomiting, chest pain, cough, shortness of breath, wheezing, abdominal pain, changes in bowel or bladder habits, etc...  Reviewed expectations re: course of current medical issues. Questions answered. Outlined signs and symptoms indicating need for more acute intervention. Patient verbalized understanding. After Visit Summary given.         Rennis HardingWurst, Aaren [place], PA-C 02/22/18 1745

## 2018-02-25 ENCOUNTER — Ambulatory Visit (HOSPITAL_COMMUNITY)
Admission: EM | Admit: 2018-02-25 | Discharge: 2018-02-25 | Disposition: A | Discharge status: Home / Self Care | Payer: Self-pay | Attending: Family Medicine | Admitting: Family Medicine

## 2018-02-25 ENCOUNTER — Encounter (HOSPITAL_COMMUNITY): Payer: Self-pay | Admitting: Emergency Medicine

## 2018-02-25 ENCOUNTER — Other Ambulatory Visit: Payer: Self-pay

## 2018-02-25 DIAGNOSIS — R509 Fever, unspecified: Secondary | ICD-10-CM

## 2018-02-25 DIAGNOSIS — J111 Influenza due to unidentified influenza virus with other respiratory manifestations: Secondary | ICD-10-CM

## 2018-02-25 DIAGNOSIS — Z20828 Contact with and (suspected) exposure to other viral communicable diseases: Secondary | ICD-10-CM | POA: Insufficient documentation

## 2018-02-25 DIAGNOSIS — M7918 Myalgia, other site: Secondary | ICD-10-CM

## 2018-02-25 DIAGNOSIS — R69 Illness, unspecified: Secondary | ICD-10-CM | POA: Insufficient documentation

## 2018-02-25 DIAGNOSIS — R5383 Other fatigue: Secondary | ICD-10-CM

## 2018-02-25 MED ORDER — OSELTAMIVIR PHOSPHATE 75 MG PO CAPS: 75.0000 mg | ORAL_CAPSULE | Freq: Two times a day (BID) | ORAL | 0 refills | Status: DC

## 2018-02-25 MED ORDER — ONDANSETRON HCL 4 MG PO TABS: 4.0000 mg | ORAL_TABLET | Freq: Four times a day (QID) | ORAL | 0 refills | Status: DC

## 2018-02-25 NOTE — ED Provider Notes (Signed)
[facility]    CSN: [id number] Arrival date & time: 02/25/18  0935     History   Chief Complaint Chief Complaint  Patient presents with  . Flu Symptoms    HPI Linda Bond is a 23 y.o. female.   HPI  Has had cold viral symptoms for over a week.  Was seen on 02/22/2018.  She was getting better from this and then spiked a fever to 103 last night.  Body aches.  Fatigue.  Her husband was diagnosed with influenza yesterday  History reviewed. No pertinent past medical history.  Patient Active Problem List   Diagnosis Date Noted  . Obesity (BMI 30.0-34.9) 06/30/2017  . Complete miscarriage 06/30/2017    Past Surgical History:  Procedure Laterality Date  . NO PAST SURGERIES      OB History    Gravida  4   Para  2   Term  2   Preterm      AB  1   Living  2     SAB  1   TAB      Ectopic      Multiple      Live Births  2            Home Medications    Prior to Admission medications   Medication Sig Start Date End Date Taking? Authorizing Provider  benzonatate (TESSALON) 100 MG capsule Take 1 capsule (100 mg total) by mouth every 8 (eight) hours. 02/22/18  Yes Wurst, [place], PA-C  cetirizine-pseudoephedrine (ZYRTEC-D) 5-120 MG tablet Take 1 tablet by mouth daily. 02/22/18  Yes Wurst, [place], PA-C  fluticasone (FLONASE) 50 MCG/ACT nasal spray Place 2 sprays into both nostrils daily. 02/22/18  Yes Wurst, [place], PA-C  ibuprofen (ADVIL) 200 MG tablet Take 600 mg by mouth every 6 (six) hours as needed for fever.   Yes [provider]  ondansetron (ZOFRAN) 4 MG tablet Take 1-2 tablets (4-8 mg total) by mouth every 6 (six) hours. 02/25/18   Eustace Moore, MD  oseltamivir (TAMIFLU) 75 MG capsule Take 1 capsule (75 mg total) by mouth every 12 (twelve) hours. 02/25/18   Eustace Moore, MD    Family History Family History  Problem Relation Age of Onset  . Healthy Mother   . Healthy Father     Social History Social History    Tobacco Use  . Smoking status: Never Smoker  . Smokeless tobacco: Never Used  Substance Use Topics  . Alcohol use: No  . Drug use: No     Allergies   Patient has no known allergies.   Review of Systems Review of Systems   Physical Exam Triage Vital Signs ED Triage Vitals  Enc Vitals Group     BP 02/25/18 1034 (!) 120/57     Pulse Rate 02/25/18 1034 92     Resp --      Temp 02/25/18 1034 99 F (37.2 C)     Temp Source 02/25/18 1034 Oral     SpO2 02/25/18 1034 99 %     Weight --      Height --      Head Circumference --      Peak Flow --      Pain Score 02/25/18 1036 0     Pain Loc --      Pain Edu? --      Excl. in GC? --    No data found.  Updated Vital Signs BP (!) 120/57 (  BP Location: Right Arm)   Pulse 92   Temp 99 F (37.2 C) (Oral)   LMP 12/02/2017 (Exact Date)   SpO2 99%   Visual Acuity Right Eye Distance:   Left Eye Distance:   Bilateral Distance:    Right Eye Near:   Left Eye Near:    Bilateral Near:     Physical Exam   UC Treatments / Results  Labs (all labs ordered are listed, but only abnormal results are displayed) Labs Reviewed - No data to display  EKG None  Radiology No results found.  Procedures Procedures (including critical care time)  Medications Ordered in UC Medications - No data to display  Initial Impression / Assessment and Plan / UC Course  I have reviewed the triage vital signs and the nursing notes.  Pertinent labs & imaging results that were available during my care of the patient were reviewed by me and considered in my medical decision making (see chart for details).      Final Clinical Impressions(s) / UC Diagnoses   Final diagnoses:  Influenza-like illness  Exposure to the flu     Discharge Instructions     Rest and push fluids Take ibuprofen for pain and fever Take Tamiflu 2 times a day for 5 days Take Zofran if needed for nausea and vomiting Tessalon medicine you got previously will  help with cough.  You need to take 200 mg twice a day   ED Prescriptions    Medication Sig Dispense Auth. Provider   ondansetron (ZOFRAN) 4 MG tablet Take 1-2 tablets (4-8 mg total) by mouth every 6 (six) hours. 20 tablet Eustace Moore, MD   oseltamivir (TAMIFLU) 75 MG capsule Take 1 capsule (75 mg total) by mouth every 12 (twelve) hours. 10 capsule Eustace Moore, MD     Controlled Substance Prescriptions [place] Controlled Substance Registry consulted? Not Applicable   Eustace Moore, MD 02/25/18 1157

## 2018-02-25 NOTE — Discharge Instructions (Signed)
Rest and push fluids Take ibuprofen for pain and fever Take Tamiflu 2 times a day for 5 days Take Zofran if needed for nausea and vomiting Tessalon medicine you got previously will help with cough.  You need to take 200 mg twice a day

## 2018-02-25 NOTE — ED Triage Notes (Signed)
Pt has been exposed to possible flu in her home by her husband and two daughters.  Her husband was here yesterday and was prescribed Tamiflu.  She is here with her daughter today with similar symptoms.

## 2019-10-31 IMAGING — US US OB TRANSVAGINAL
1 series · 15 of 28 positions shown · non-contrast
Comparison: None.

CLINICAL DATA: Bleeding.

EXAM:
OBSTETRIC <14 WK US AND TRANSVAGINAL OB US
TECHNIQUE: Both transabdominal and transvaginal ultrasound examinations were
performed for complete evaluation of the gestation as well as the
maternal uterus, adnexal regions, and pelvic cul-de-sac.
Transvaginal technique was performed to assess early pregnancy.

[Series 1: us ob transvaginal · 15 of 33 slices shown]
[im 1/33]
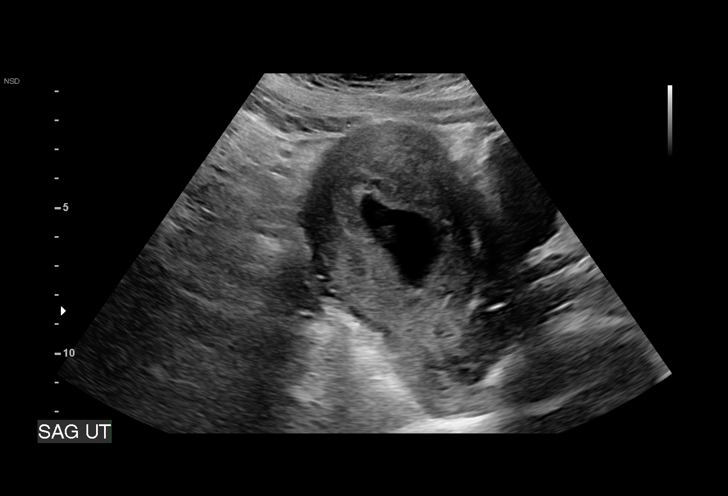
[im 3/33]
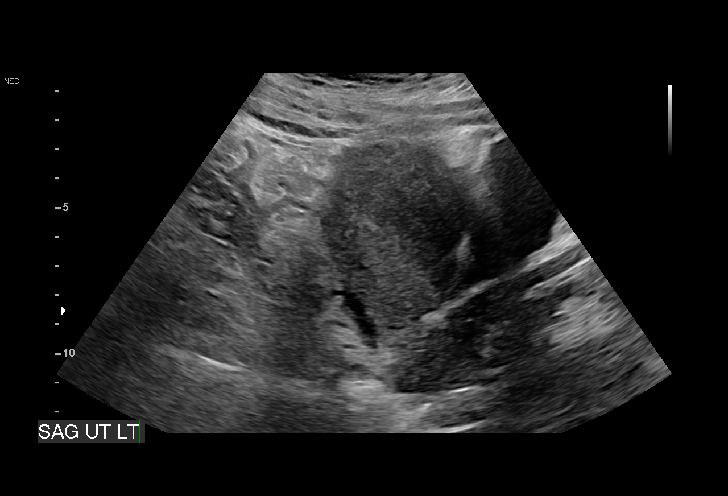
[im 5/33]
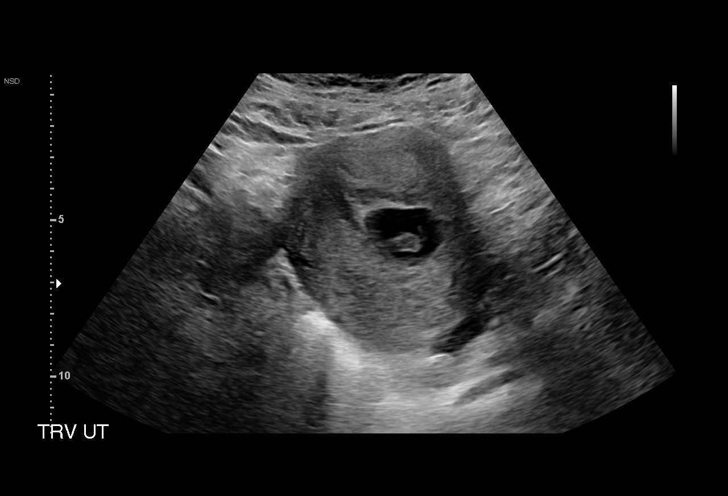
[im 8/33]
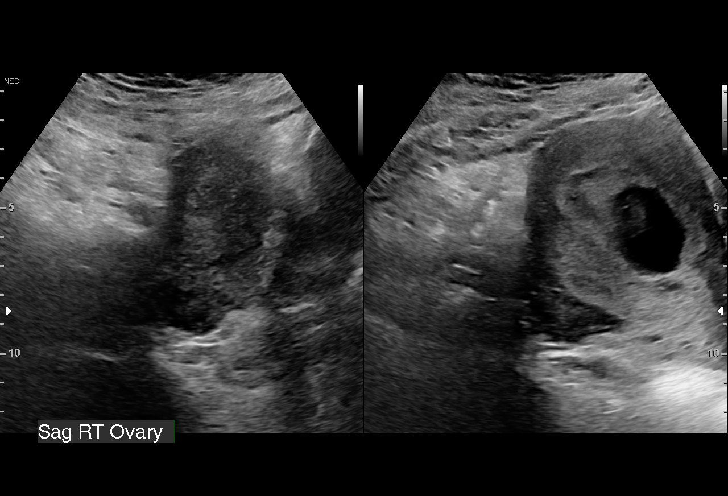
[im 10/33]
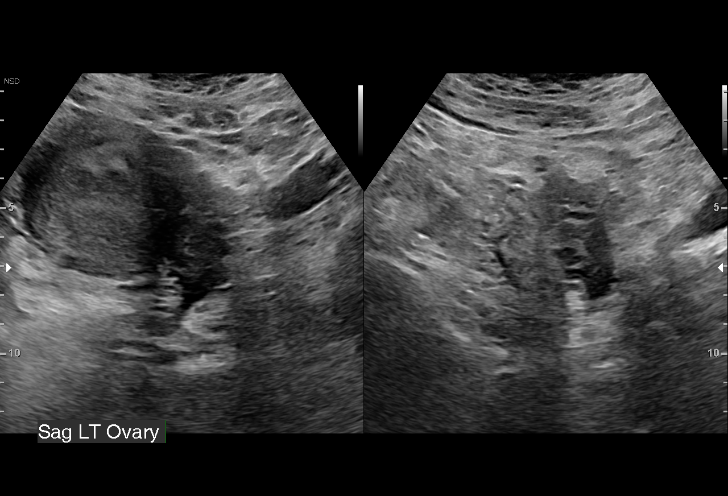
[im 12/33]
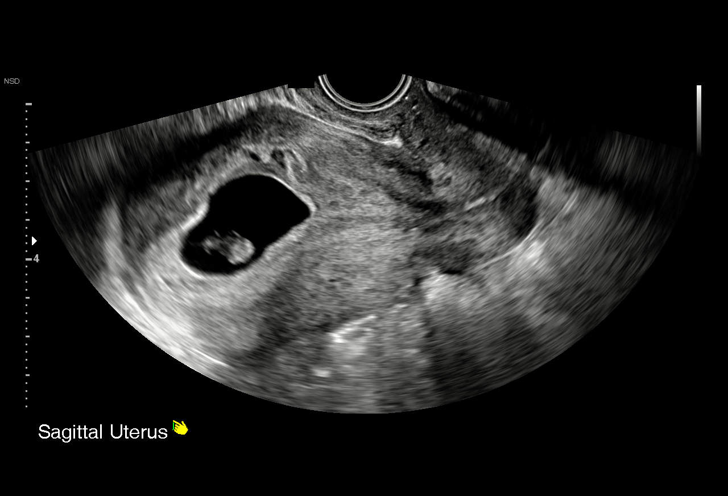
[im 15/33]
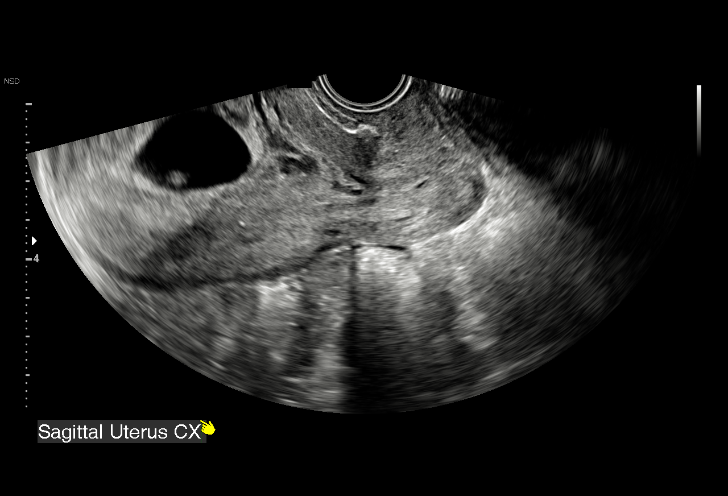
[im 17/33]
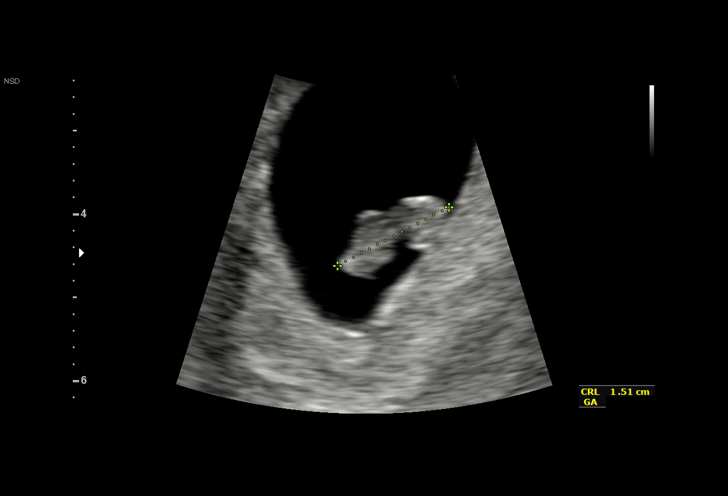
[im 18/33]
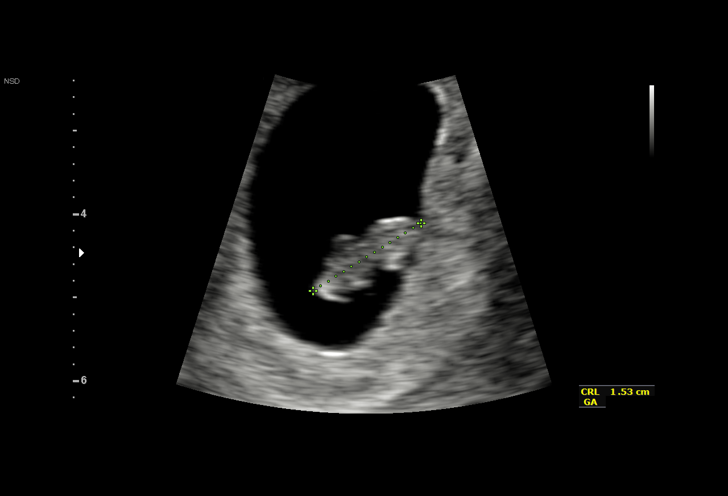
[im 21/33]
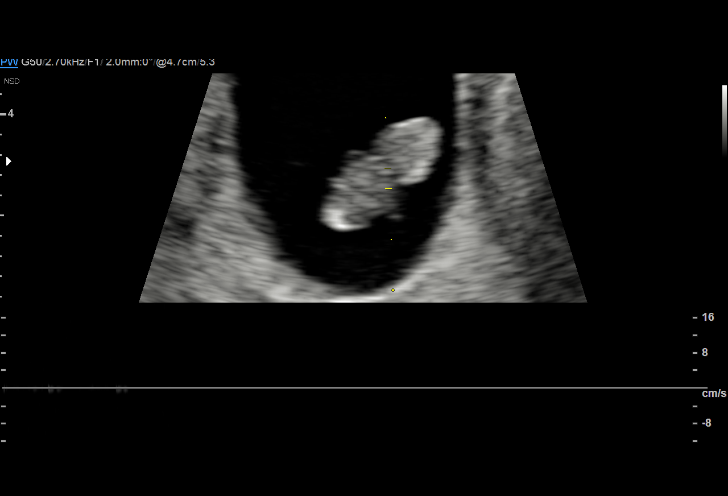
[im 23/33]
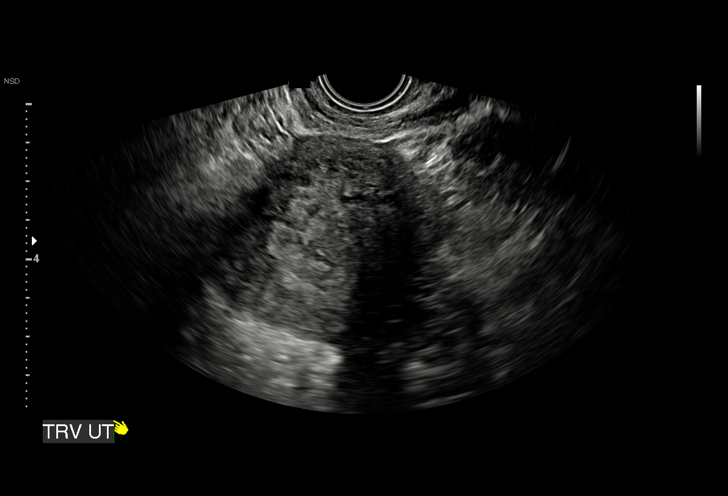
[im 25/33]
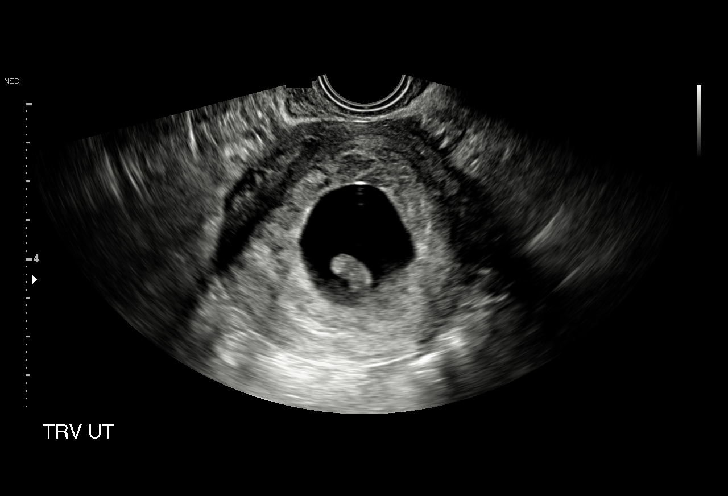
[im 28/33]
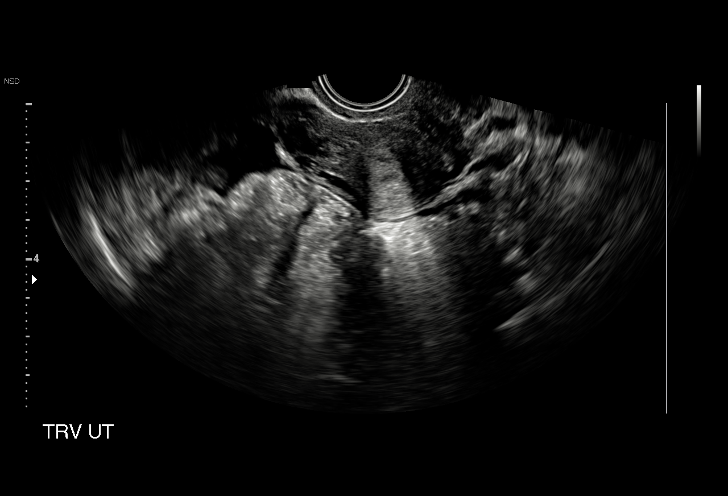
[im 30/33]
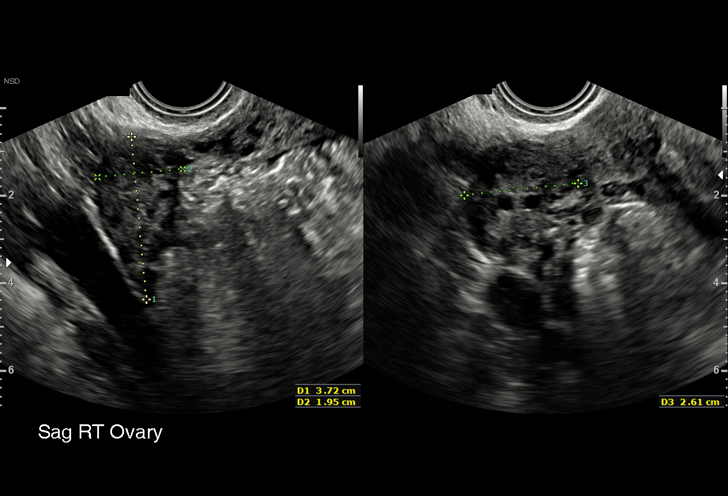
[im 33/33]
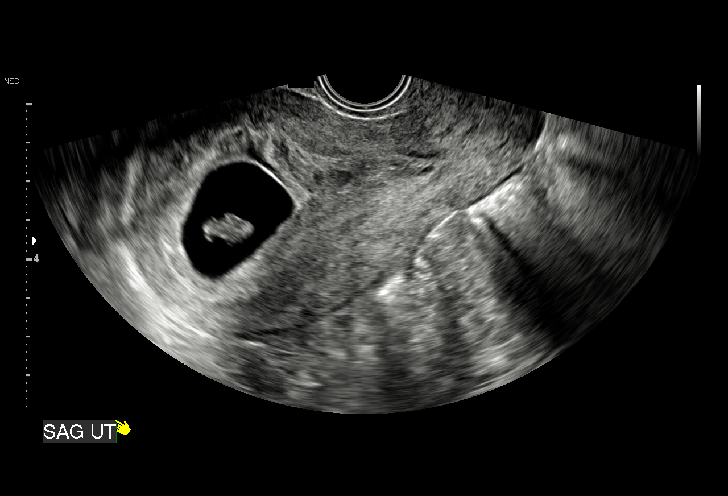

[15 of 28 positions shown; findings below may reference images not displayed]

FINDINGS: Intrauterine gestational sac: Single

Yolk sac:  Not Visualized.

Embryo:  Visualized.

Cardiac Activity: Not Visualized.

Heart Rate: 0  bpm

CRL:  15 mm   7 w   5 d

Subchorionic hemorrhage:  None visualized.

Maternal uterus/adnexae:

Subchorionic hemorrhage: None

Right ovary: Normal

Left ovary: Normal

Other :None

Free fluid:  None
IMPRESSION: 1. Findings meet definitive criteria for failed pregnancy. This
follows SRU consensus guidelines: Diagnostic Criteria for Nonviable
Pregnancy Early in the First Trimester. N Engl J Med

## 2020-04-05 ENCOUNTER — Inpatient Hospital Stay (HOSPITAL_COMMUNITY)
Admission: AD | Admit: 2020-04-05 | Discharge: 2020-04-05 | Disposition: A | Discharge status: Home / Self Care | Payer: Self-pay | Attending: Obstetrics and Gynecology | Admitting: Obstetrics and Gynecology

## 2020-04-05 ENCOUNTER — Other Ambulatory Visit: Payer: Self-pay

## 2020-04-05 ENCOUNTER — Inpatient Hospital Stay (HOSPITAL_COMMUNITY): Payer: Self-pay

## 2020-04-05 ENCOUNTER — Encounter (HOSPITAL_COMMUNITY): Payer: Self-pay | Admitting: Obstetrics and Gynecology

## 2020-04-05 DIAGNOSIS — O209 Hemorrhage in early pregnancy, unspecified: Secondary | ICD-10-CM | POA: Insufficient documentation

## 2020-04-05 DIAGNOSIS — Z791 Long term (current) use of non-steroidal anti-inflammatories (NSAID): Secondary | ICD-10-CM | POA: Insufficient documentation

## 2020-04-05 DIAGNOSIS — Z3A08 8 weeks gestation of pregnancy: Secondary | ICD-10-CM | POA: Insufficient documentation

## 2020-04-05 DIAGNOSIS — O4691 Antepartum hemorrhage, unspecified, first trimester: Secondary | ICD-10-CM

## 2020-04-05 DIAGNOSIS — Z3A01 Less than 8 weeks gestation of pregnancy: Secondary | ICD-10-CM

## 2020-04-05 DIAGNOSIS — O4690 Antepartum hemorrhage, unspecified, unspecified trimester: Secondary | ICD-10-CM

## 2020-04-05 DIAGNOSIS — Z6790 Unspecified blood type, Rh positive: Secondary | ICD-10-CM

## 2020-04-05 LAB — WET PREP, GENITAL
Clue Cells Wet Prep HPF POC: NONE SEEN
Sperm: NONE SEEN
Trich, Wet Prep: NONE SEEN
Yeast Wet Prep HPF POC: NONE SEEN

## 2020-04-05 LAB — URINALYSIS, ROUTINE W REFLEX MICROSCOPIC
Bilirubin Urine: NEGATIVE
Glucose, UA: NEGATIVE mg/dL
Ketones, ur: NEGATIVE mg/dL
Nitrite: NEGATIVE
Protein, ur: NEGATIVE mg/dL
Specific Gravity, Urine: 1.009 (ref 1.005–1.030)
pH: 7 (ref 5.0–8.0)

## 2020-04-05 LAB — CBC
HCT: 37.8 % (ref 36.0–46.0)
Hemoglobin: 12 g/dL (ref 12.0–15.0)
MCH: 25.7 pg — ABNORMAL LOW (ref 26.0–34.0)
MCHC: 31.7 g/dL (ref 30.0–36.0)
MCV: 80.9 fL (ref 80.0–100.0)
Platelets: 311 10*3/uL (ref 150–400)
RBC: 4.67 MIL/uL (ref 3.87–5.11)
RDW: 14.3 % (ref 11.5–15.5)
WBC: 11.1 10*3/uL — ABNORMAL HIGH (ref 4.0–10.5)
nRBC: 0 % (ref 0.0–0.2)

## 2020-04-05 LAB — HCG, QUANTITATIVE, PREGNANCY: hCG, Beta Chain, Quant, S: 11708 m[IU]/mL — ABNORMAL HIGH (ref <5)

## 2020-04-05 LAB — POCT PREGNANCY, URINE: Preg Test, Ur: POSITIVE — AB

## 2020-04-05 NOTE — MAU Provider Note (Signed)
History     CSN: [id number]  Arrival date and time: 04/05/20 1826   Event Date/Time   First Provider Initiated Contact with Patient 04/05/20 1902      Chief Complaint  Patient presents with  . Vaginal Bleeding  . Possible Pregnancy   25 y.o. [id number] @[redacted]w[redacted]d  presenting with VB. Reports onset today. Started with spotting when she wiped then had a gush of red blood that stained her clothes. No recent sex. Denies pain or cramping.   OB History    Gravida  4   Para  2   Term  2   Preterm      AB  1   Living  2     SAB  1   IAB      Ectopic      Multiple      Live Births  2           History reviewed. No pertinent past medical history.  Past Surgical History:  Procedure Laterality Date  . NO PAST SURGERIES      Family History  Problem Relation Age of Onset  . Healthy Mother   . Healthy Father     Social History   Tobacco Use  . Smoking status: Never Smoker  . Smokeless tobacco: Never Used  Substance Use Topics  . Alcohol use: No  . Drug use: No    Allergies: No Known Allergies  No medications prior to admission.    Review of Systems  Gastrointestinal: Negative for abdominal pain.  Genitourinary: Positive for vaginal bleeding.   Physical Exam   Blood pressure 115/60, pulse 87, temperature 98.7 F (37.1 C), temperature source Oral, resp. rate 16, height 5\' 5"  (1.651 m), weight 113.1 kg, last menstrual period 02/04/2020, SpO2 100 %, unknown if currently breastfeeding.  Physical Exam Vitals and nursing note reviewed. Exam conducted with a chaperone present.  Constitutional:      General: She is not in acute distress.    Appearance: Normal appearance.  HENT:     Head: Normocephalic and atraumatic.  Cardiovascular:     Rate and Rhythm: Normal rate.  Pulmonary:     Effort: Pulmonary effort is normal. No respiratory distress.  Abdominal:     General: There is no distension.     Palpations: Abdomen is soft. There is no mass.      Tenderness: There is no abdominal tenderness. There is no guarding or rebound.     Hernia: No hernia is present.  Genitourinary:    Comments: External: no lesions or erythema Vagina: rugated, pink, moist, small amt bloody discharge, cleared with 1 fox swab Uterus: non enlarged, anteverted, non tender, no CMT Adnexae: no masses, no tenderness left, no tenderness right Cervix closed  Musculoskeletal:     Cervical back: Normal range of motion.  Skin:    General: Skin is warm and dry.  Neurological:     General: No focal deficit present.     Mental Status: She is alert and oriented to person, place, and time.  Psychiatric:        Mood and Affect: Mood normal.        Behavior: Behavior normal.    Results for orders placed or performed during the hospital encounter of 04/05/20 (from the past 24 hour(s))  Pregnancy, urine POC     Status: Abnormal   Collection Time: 04/05/20  6:41 PM  Result Value Ref Range   Preg Test, Ur POSITIVE (A) NEGATIVE  Urinalysis, Routine w  reflex microscopic Urine, Clean Catch     Status: Abnormal   Collection Time: 04/05/20  6:48 PM  Result Value Ref Range   Color, Urine YELLOW YELLOW   APPearance CLEAR CLEAR   Specific Gravity, Urine 1.009 1.005 - 1.030   pH 7.0 5.0 - 8.0   Glucose, UA NEGATIVE NEGATIVE mg/dL   Hgb urine dipstick LARGE (A) NEGATIVE   Bilirubin Urine NEGATIVE NEGATIVE   Ketones, ur NEGATIVE NEGATIVE mg/dL   Protein, ur NEGATIVE NEGATIVE mg/dL   Nitrite NEGATIVE NEGATIVE   Leukocytes,Ua MODERATE (A) NEGATIVE   RBC / HPF 0-5 0 - 5 RBC/hpf   WBC, UA 0-5 0 - 5 WBC/hpf   Bacteria, UA RARE (A) NONE SEEN   Squamous Epithelial / LPF 0-5 0 - 5   Mucus PRESENT   Wet prep, genital     Status: Abnormal   Collection Time: 04/05/20  6:55 PM   Specimen: PATH Cytology Cervicovaginal Ancillary Only  Result Value Ref Range   Yeast Wet Prep HPF POC NONE SEEN NONE SEEN   Trich, Wet Prep NONE SEEN NONE SEEN   Clue Cells Wet Prep HPF POC NONE SEEN  NONE SEEN   WBC, Wet Prep HPF POC MANY (A) NONE SEEN   Sperm NONE SEEN   CBC     Status: Abnormal   Collection Time: 04/05/20  7:04 PM  Result Value Ref Range   WBC 11.1 (H) 4.0 - 10.5 K/uL   RBC 4.67 3.87 - 5.11 MIL/uL   Hemoglobin 12.0 12.0 - 15.0 g/dL   HCT 01.7 51.0 - 25.8 %   MCV 80.9 80.0 - 100.0 fL   MCH 25.7 (L) 26.0 - 34.0 pg   MCHC 31.7 30.0 - 36.0 g/dL   RDW 52.7 78.2 - 42.3 %   Platelets 311 150 - 400 K/uL   nRBC 0.0 0.0 - 0.2 %   US OB LESS THAN 14 WEEKS WITH OB TRANSVAGINAL  Result Date: 04/05/2020 CLINICAL DATA:  Pregnant patient in first-trimester pregnancy with vaginal bleeding today. Gestational age [redacted] weeks 5 days by LMP. EXAM: OBSTETRIC <14 WK [place] AND TRANSVAGINAL OB US TECHNIQUE: Both transabdominal and transvaginal ultrasound examinations were performed for complete evaluation of the gestation as well as the maternal uterus, adnexal regions, and pelvic cul-de-sac. Transvaginal technique was performed to assess early pregnancy. COMPARISON:  None this pregnancy. FINDINGS: Intrauterine gestational sac: Single Yolk sac:  Visualized, enlarged measuring 10 mm. Embryo:  Visualized. Cardiac Activity: Not Visualized. CRL:  4.1 mm   6 w   1 d                  [place] EDC: 11/28/2020 Subchorionic hemorrhage:  None visualized. Maternal uterus/adnexae: Hyperechoic 3.6 cm fibroid in the fundus anteriorly. Both ovaries are visualized and are normal. There is no adnexal mass. No pelvic free fluid. IMPRESSION: Intrauterine gestational sac containing an enlarged yolk sac and fetal pole at 4 mm corresponding to gestational age of [redacted] weeks 1 day, but no cardiac activity. Findings are suspicious but not yet definitive for failed pregnancy. Recommend follow-up US in 10-14 days for definitive diagnosis. This recommendation follows SRU consensus guidelines: Diagnostic Criteria for Nonviable Pregnancy Early in the First Trimester. Malva Limes Med 2013; 536:1443-15. Electronically Signed   By: Narda Rutherford  M.D.   On: 04/05/2020 19:39   MAU Course  Procedures  MDM Labs and [place] ordered and reviewed. Early IUP on [place] w/o cardiac activity and enlarged YS. Recommend f/u US  for viability. Stable for discharge home.   Assessment and Plan   1. [redacted] weeks gestation of pregnancy   2. Vaginal bleeding in pregnancy   3. Blood type, Rh positive    Discharge home Follow up at [facility] for [place] in 10 days, ordered SAB precautions Pelvic rest  Allergies as of 04/05/2020   No Known Allergies     Medication List    STOP taking these medications   Advil 200 MG tablet Generic drug: ibuprofen   benzonatate 100 MG capsule Commonly known as: TESSALON   cetirizine-pseudoephedrine 5-120 MG tablet Commonly known as: ZYRTEC-D   ondansetron 4 MG tablet Commonly known as: ZOFRAN   oseltamivir 75 MG capsule Commonly known as: TAMIFLU     TAKE these medications   fluticasone 50 MCG/ACT nasal spray Commonly known as: FLONASE Place 2 sprays into both nostrils daily.      Donette Larry, CNM 04/05/2020, 8:39 PM

## 2020-04-05 NOTE — Discharge Instructions (Signed)
Threatened Miscarriage A threatened miscarriage is when a woman bleeds in her vagina during the first 20 weeks of pregnancy but the pregnancy has not ended. The doctor will do tests to make sure you are still pregnant. This condition does not mean your pregnancy will end, but it does increase the risk that it will end (miscarriage). What are the causes? Normally, the cause of this condition is not known. What increases the risk? These things may make a pregnant woman more likely to lose a pregnancy: Certain health problems  Conditions that affect hormones, such as thyroid disease or polycystic ovary syndrome.  Diabetes.  Disorders that cause the body's disease-fighting system to attack itself by mistake.  Infections.  Bleeding problems.  Being very overweight. Lifestyle factors  Using products that have tobacco or nicotine in them.  Being around tobacco smoke.  Having a lot of caffeine.  Using drugs. Problems with reproductive organs or parts  Having a cervix that opens and thins before you are ready to give birth. The cervix is the lowest part of the womb.  Having Asherman syndrome, which leads to: ? Scars in the womb. ? The womb being an abnormal shape.  Growths (fibroids) in the womb.  Problems in the body that are present at birth.  Infection of the cervix or womb. Personal or health history  Injury.  Having lost an unborn baby before.  Being younger than age 18 or older than age 35.  Being around a harmful substance, such as radiation.  Having lead or other heavy metals in: ? Things you eat or drink. ? The air around you.  Using certain medicines. What are the signs or symptoms?  Bleeding from the vagina. You may also have cramps or pain.  Mild pain or cramps in your belly. How is this diagnosed?  The doctor will do tests, such as an ultrasound to make sure you are still pregnant.   How is this treated? There are no treatments that prevent loss of  pregnancy. But you need to do the right things to take care of yourself at home. Follow these instructions at home:  Get a lot of rest.  Do not have sex or douche if there is bleeding in the vagina.  Do not put things such as tampons in the vagina if it is bleeding.  Do not smoke or use drugs.  Do not drink alcohol.  Avoid caffeine.  Keep all follow-up visits while you are pregnant. Contact a doctor if:  You are pregnant and you have one of these: ? Light bleeding coming from your vagina. ? Spots of blood coming from your vagina.  You have belly pain or cramping.  You have a fever. Get help right away if:  Blood soaks through 2 large pads an hour for more than 2 hours.  Clots of blood come from your vagina.  Tissue comes out of your vagina.  Fluid leaks or gushes from your vagina.  You have very bad pain in your low back.  You have very bad cramps in your belly.  You have a fever, chills, and very bad belly pain. Summary  A threatened miscarriage is when a woman bleeds in her vagina during the first 20 weeks of pregnancy but the pregnancy has not ended.  Normally, the cause of this condition is not known.  Symptoms include bleeding in the vagina or mild pain or cramps in your belly.  There are no treatments that prevent loss of pregnancy.  Keep   all follow-up visits while you are pregnant. This information is not intended to replace advice given to you by your health care provider. Make sure you discuss any questions you have with your health care provider. Document Revised: 08/11/2019 Document Reviewed: 08/11/2019 Elsevier Patient Education  2021 Elsevier Inc.  

## 2020-04-05 NOTE — MAU Note (Signed)
Linda Bond is a 25 y.o. here in MAU reporting: vaginal bleeding that started today, states it has come through onto her pants but she is not wearing a pad. No pain. Confirmed pregnancy at [facility] but does not have letter with her.  LMP: 02/04/20 irregular cycles  Onset of complaint: today  Pain score: 0/10  Vitals:   04/05/20 1847  BP: (!) 142/83  Pulse: 85  Resp: 16  Temp: 98.7 F (37.1 C)  SpO2: 100%     Lab orders placed from triage: UPT, UA

## 2020-04-09 ENCOUNTER — Inpatient Hospital Stay (HOSPITAL_COMMUNITY): Payer: Self-pay

## 2020-04-09 ENCOUNTER — Inpatient Hospital Stay (HOSPITAL_COMMUNITY)
Admission: AD | Admit: 2020-04-09 | Discharge: 2020-04-09 | Disposition: A | Discharge status: Home / Self Care | Payer: Self-pay | Attending: Obstetrics and Gynecology | Admitting: Obstetrics and Gynecology

## 2020-04-09 ENCOUNTER — Other Ambulatory Visit: Payer: Self-pay

## 2020-04-09 DIAGNOSIS — Z3A08 8 weeks gestation of pregnancy: Secondary | ICD-10-CM | POA: Insufficient documentation

## 2020-04-09 DIAGNOSIS — Z3A01 Less than 8 weeks gestation of pregnancy: Secondary | ICD-10-CM

## 2020-04-09 DIAGNOSIS — O4690 Antepartum hemorrhage, unspecified, unspecified trimester: Secondary | ICD-10-CM

## 2020-04-09 DIAGNOSIS — Z6790 Unspecified blood type, Rh positive: Secondary | ICD-10-CM | POA: Insufficient documentation

## 2020-04-09 DIAGNOSIS — O039 Complete or unspecified spontaneous abortion without complication: Secondary | ICD-10-CM | POA: Insufficient documentation

## 2020-04-09 LAB — CBC
HCT: 36.1 % (ref 36.0–46.0)
Hemoglobin: 11.5 g/dL — ABNORMAL LOW (ref 12.0–15.0)
MCH: 26 pg (ref 26.0–34.0)
MCHC: 31.9 g/dL (ref 30.0–36.0)
MCV: 81.5 fL (ref 80.0–100.0)
Platelets: 315 10*3/uL (ref 150–400)
RBC: 4.43 MIL/uL (ref 3.87–5.11)
RDW: 14.3 % (ref 11.5–15.5)
WBC: 9.4 10*3/uL (ref 4.0–10.5)
nRBC: 0 % (ref 0.0–0.2)

## 2020-04-09 LAB — HCG, QUANTITATIVE, PREGNANCY: hCG, Beta Chain, Quant, S: 961 m[IU]/mL — ABNORMAL HIGH (ref <5)

## 2020-04-09 LAB — GC/CHLAMYDIA PROBE AMP (~~LOC~~) NOT AT ARMC
Chlamydia: NEGATIVE
Comment: NEGATIVE
Comment: NORMAL
Neisseria Gonorrhea: NEGATIVE

## 2020-04-09 NOTE — MAU Note (Signed)
Pt evaluated for VB on Friday and was told everything looked good but told possible miscarriage.  On Sunday around 1330 she started having abdominal pain and passed something that may have resembled tissue.  Reports she is continuing to having vaginal bleeding that is about the same as before.  Cramping has started to improve.

## 2020-04-09 NOTE — MAU Provider Note (Signed)
History     CSN: [id number]  Arrival date and time: 04/09/20 1334   Event Date/Time   First Provider Initiated Contact with Patient 04/09/20 1606      Chief Complaint  Patient presents with  . Vaginal Bleeding   [name] is a 25 y.o. [id number] at [redacted]w[redacted]d who presents to MAU for vaginal bleeding. Patient reports she was here in MAU on Friday for vaginal bleeding and was told she was likely having a miscarriage. Patient reports she experienced heavy bleeding and cramping on Sunday with pain that was very severe, and she "pushed something out" on the toilet that she thinks was a gestational sac. Patient denies any pelvic or abdominal pain at this time, and has not changed her pad since yesterday and reports minimal bleeding at this time. Patient is here to see if she had a miscarriage at the urging of her family and friends.  Pt denies vaginal discharge/odor/itching. Pt denies N/V, abdominal pain, constipation, diarrhea, or urinary problems. Pt denies fever, chills, fatigue, sweating or changes in appetite. Pt denies SOB or chest pain. Pt denies dizziness, HA, light-headedness, weakness.   OB History    Gravida  4   Para  2   Term  2   Preterm      AB  1   Living  2     SAB  1   IAB      Ectopic      Multiple      Live Births  2           No past medical history on file.  Past Surgical History:  Procedure Laterality Date  . NO PAST SURGERIES      Family History  Problem Relation Age of Onset  . Healthy Mother   . Healthy Father     Social History   Tobacco Use  . Smoking status: Never Smoker  . Smokeless tobacco: Never Used  Substance Use Topics  . Alcohol use: No  . Drug use: No    Allergies: No Known Allergies  Medications Prior to Admission  Medication Sig Dispense Refill Last Dose  . fluticasone (FLONASE) 50 MCG/ACT nasal spray Place 2 sprays into both nostrils daily. 16 g 0     Review of Systems  Constitutional: Negative for  chills, diaphoresis, fatigue and fever.  Eyes: Negative for visual disturbance.  Respiratory: Negative for shortness of breath.   Cardiovascular: Negative for chest pain.  Gastrointestinal: Negative for abdominal pain, constipation, diarrhea, nausea and vomiting.  Genitourinary: Positive for vaginal bleeding. Negative for dysuria, flank pain, frequency, pelvic pain, urgency and vaginal discharge.  Neurological: Negative for dizziness, weakness, light-headedness and headaches.   Physical Exam   Blood pressure 119/71, pulse 84, temperature 97.8 F (36.6 C), temperature source Oral, resp. rate 17, weight 112.9 kg, last menstrual period 02/04/2020, unknown if currently breastfeeding.  Patient Vitals for the past 24 hrs:  BP Temp Temp src Pulse Resp Weight  04/09/20 1419 119/71 97.8 F (36.6 C) Oral 84 17 -  04/09/20 1414 - - - - - 112.9 kg   Physical Exam Vitals and nursing note reviewed.  Constitutional:      General: She is not in acute distress.    Appearance: Normal appearance. She is not ill-appearing, toxic-appearing or diaphoretic.  HENT:     Head: Normocephalic and atraumatic.  Pulmonary:     Effort: Pulmonary effort is normal.  Neurological:     Mental Status: She is alert  and oriented to person, place, and time.  Psychiatric:        Mood and Affect: Mood normal.        Behavior: Behavior normal.        Thought Content: Thought content normal.        Judgment: Judgment normal.    Results for orders placed or performed during the hospital encounter of 04/09/20 (from the past 24 hour(s))  CBC     Status: Abnormal   Collection Time: 04/09/20  4:16 PM  Result Value Ref Range   WBC 9.4 4.0 - 10.5 K/uL   RBC 4.43 3.87 - 5.11 MIL/uL   Hemoglobin 11.5 (L) 12.0 - 15.0 g/dL   HCT 38.4 66.5 - 99.3 %   MCV 81.5 80.0 - 100.0 fL   MCH 26.0 26.0 - 34.0 pg   MCHC 31.9 30.0 - 36.0 g/dL   RDW 57.0 17.7 - 93.9 %   Platelets 315 150 - 400 K/uL   nRBC 0.0 0.0 - 0.2 %    US OB  Transvaginal  Result Date: 04/09/2020 CLINICAL DATA:  Vaginal bleeding EXAM: TRANSVAGINAL OB ULTRASOUND TECHNIQUE: Transvaginal ultrasound was performed for complete evaluation of the gestation as well as the maternal uterus, adnexal regions, and pelvic cul-de-sac. COMPARISON:  04/05/2020 FINDINGS: Intrauterine gestational sac: None Yolk sac:  Not Visualized. Embryo:  Not Visualized. Cardiac Activity: Not Visualized. Heart Rate:  bpm MSD:   mm    w     d CRL:     mm    w  d                  [place] EDC: Subchorionic hemorrhage:  None visualized. Maternal uterus/adnexae: No adnexal mass or free fluid. Neither ovary visualized. Endometrium 13 mm in thickness. Fundal fibroid measures 3.7 cm. IMPRESSION: Previously seen intrauterine gestation no longer visualized compatible with spontaneous abortion. Electronically Signed   By: Charlett Nose M.D.   On: 04/09/2020 16:51   US OB LESS THAN 14 WEEKS WITH OB TRANSVAGINAL  Result Date: 04/05/2020 CLINICAL DATA:  Pregnant patient in first-trimester pregnancy with vaginal bleeding today. Gestational age [redacted] weeks 5 days by LMP. EXAM: OBSTETRIC <14 WK [place] AND TRANSVAGINAL OB US TECHNIQUE: Both transabdominal and transvaginal ultrasound examinations were performed for complete evaluation of the gestation as well as the maternal uterus, adnexal regions, and pelvic cul-de-sac. Transvaginal technique was performed to assess early pregnancy. COMPARISON:  None this pregnancy. FINDINGS: Intrauterine gestational sac: Single Yolk sac:  Visualized, enlarged measuring 10 mm. Embryo:  Visualized. Cardiac Activity: Not Visualized. CRL:  4.1 mm   6 w   1 d                  [place] EDC: 11/28/2020 Subchorionic hemorrhage:  None visualized. Maternal uterus/adnexae: Hyperechoic 3.6 cm fibroid in the fundus anteriorly. Both ovaries are visualized and are normal. There is no adnexal mass. No pelvic free fluid. IMPRESSION: Intrauterine gestational sac containing an enlarged yolk sac and fetal pole at 4 mm  corresponding to gestational age of [redacted] weeks 1 day, but no cardiac activity. Findings are suspicious but not yet definitive for failed pregnancy. Recommend follow-up US in 10-14 days for definitive diagnosis. This recommendation follows SRU consensus guidelines: Diagnostic Criteria for Nonviable Pregnancy Early in the First Trimester. Malva Limes Med 2013; 030:0923-30. Electronically Signed   By: Narda Rutherford M.D.   On: 04/05/2020 19:39    MAU Course  Procedures  MDM -minimal VB with  possible miscarriage on Sunday -RH positive -CBC: WNL -hCG: pending at time of discharge -[place]: no GS/yolk sac/embryo, +fundal fibroid (single GS, yolk sac, embryo without cardiac activity previously visualized 04/05/2020, no longer present, c/w SAB) -pt discharged to home in stable condition  Orders Placed This Encounter  Procedures  . US OB Transvaginal    Standing Status:   Standing    Number of Occurrences:   1    Order Specific Question:   Symptom/Reason for Exam    Answer:   Vaginal bleeding in pregnancy [[id number]]  . CBC    Standing Status:   Standing    Number of Occurrences:   1  . hCG, quantitative, pregnancy    Standing Status:   Standing    Number of Occurrences:   1  . Discharge patient    Order Specific Question:   Discharge disposition    Answer:   01-Home or Self Care [1]    Order Specific Question:   Discharge patient date    Answer:   04/09/2020   No orders of the defined types were placed in this encounter.   Assessment and Plan   1. Miscarriage   2. Vaginal bleeding in pregnancy   3. Blood type, Rh positive   4. [redacted] weeks gestation of pregnancy     Allergies as of 04/09/2020   No Known Allergies     Medication List    TAKE these medications   fluticasone 50 MCG/ACT nasal spray Commonly known as: FLONASE Place 2 sprays into both nostrils daily.      -message sent to [facility] to schedule SAB f/u lab appt in one week and lab plus provider visit in two weeks -SAB precautions  given -return MAU precautions given -pt discharged to home in stable condition  Joni Reining E Nugent 04/09/2020, 5:35 PM

## 2020-04-09 NOTE — Discharge Instructions (Signed)
Miscarriage A miscarriage is the loss of pregnancy before the 20th week. Most miscarriages happen during the first 3 months of pregnancy. Sometimes, a miscarriage can happen before a woman knows that she is pregnant. Having a miscarriage can be an emotional experience. If you have had a miscarriage, talk with your health care provider about any questions you may have about the loss of your baby, the grieving process, and your plans for future pregnancy. What are the causes? Many times, the cause of a miscarriage is not known. What increases the risk? The following factors may make a pregnant woman more likely to have a miscarriage: Certain medical conditions  Conditions that affect the hormone balance in the body, such as thyroid disease or polycystic ovary syndrome.  Diabetes.  Autoimmune disorders.  Infections.  Bleeding disorders.  Obesity. Lifestyle factors  Using products with tobacco or nicotine in them or being exposed to tobacco smoke.  Having alcohol.  Having large amounts of caffeine.  Recreational drug use. Problems with reproductive organs or structures  Cervical insufficiency. This is when the lowest part of the uterus (cervix) opens and thins before pregnancy is at term.  Having a condition called Asherman syndrome. This syndrome causes scarring in the uterus or causes the uterus to be abnormal in structure.  Fibrous growths, called fibroids, in the uterus.  Congenital abnormalities. These problems are present at birth.  Infection of the cervix or uterus. Personal or medical history  Injury (trauma).  Having had a miscarriage before.  Being younger than age 18 or older than age 35.  Exposure to harmful substances in the environment. This may include radiation or heavy metals, such as lead.  Use of certain medicines. What are the signs or symptoms? Symptoms of this condition include:  Vaginal bleeding or spotting, with or without cramps or  pain.  Pain or cramping in the abdomen or lower back.  Fluid or tissue coming out of the vagina. How is this diagnosed? This condition may be diagnosed based on:  A physical exam.  Ultrasound.  Lab tests, such as blood tests, urine tests, or swabs for infection. How is this treated? Treatment for a miscarriage is sometimes not needed if all the pregnancy tissue that was in the uterus comes out on its own, and there are no other problems such as infection or heavy bleeding. In other cases, this condition may be treated with:  Dilation and curettage (D&C). In this procedure, the cervix is stretched open and any remaining pregnancy tissue is removed from the lining of the uterus (endometrium).  Medicines. These may include: ? Antibiotic medicine, to treat infection. ? Medicine to help any remaining pregnancy tissue come out of the body. ? Medicine to reduce (contract) the size of the uterus. These medicines may be given if there is a lot of bleeding. If you have Rh-negative blood, you may be given an injection of a medicine called Rho(D) immune globulin. This medicine helps prevent problems with future pregnancies. Follow these instructions at home: Medicines  Take over-the-counter and prescription medicines only as told by your health care provider.  If you were prescribed antibiotic medicine, take it as told by your health care provider. Do not stop taking the antibiotic even if you start to feel better. Activity  Rest as told by your health care provider. Ask your health care provider what activities are safe for you.  Have someone help with home and family responsibilities during this time. General instructions  Monitor how much tissue   or blood clot material comes out of the vagina.  Do not have sex, douche, or put anything, such as tampons, in your vagina until your health care provider says it is okay.  To help you and your partner with the grieving process, talk with your  health care provider or get counseling.  When you are ready, meet with your health care provider to discuss any important steps you should take for your health. Also, discuss steps you should take to have a healthy pregnancy in the future.  Keep all follow-up visits. This is important.   Where to find more information  The [employer] of Obstetricians and Gynecologists: acog.org  U.S. Department of Health and Programmer, systems of Women's Health: EverydayCosmetics.no Contact a health care provider if:  You have a fever or chills.  There is bad-smelling fluid coming from the vagina.  You have more bleeding instead of less.  Tissue or blood clots come out of your vagina. Get help right away if:  You have severe cramps or pain in your back or abdomen.  Heavy bleeding soaks through 2 large sanitary pads an hour for more than 2 hours.  You become light-headed or weak.  You faint.  You feel sad, and your sadness takes over your thoughts.  You think about hurting yourself. If you ever feel like you may hurt yourself or others, or have thoughts about taking your own life, get help right away. Go to your nearest emergency department or:  Call your local emergency services (911 in the [place]  Call a suicide crisis helpline, such as the [place] at 250-433-1704. This is open 24 hours a day in the [place]  Text the Crisis Text Line at 4301527480 (in the [place] Summary  Most miscarriages happen in the first 3 months of pregnancy. Sometimes miscarriage happens before a woman knows that she is pregnant.  Follow instructions from your health care provider about medicines and activity.  To help you and your partner with grieving, talk with your health care provider or get counseling.  Keep all follow-up visits. This information is not intended to replace advice given to you by your health care provider. Make sure you discuss any questions you  have with your health care provider. Document Revised: 08/11/2019 Document Reviewed: 08/11/2019 Elsevier Patient Education  2021 [employer].        Managing Pregnancy Loss Pregnancy loss can happen any time during a pregnancy. Often the cause is not known. It is rarely because of anything you did. Pregnancy loss in early pregnancy (during the first trimester) is called a miscarriage. This type of pregnancy loss is the most common. Pregnancy loss that happens after 20 weeks of pregnancy is called fetal demise if the baby's heart stops beating before birth. Fetal demise is much less common. Some women experience spontaneous labor shortly after fetal demise resulting in a stillborn birth (stillbirth). Any pregnancy loss can be devastating. You will need to recover both physically and emotionally. Most women are able to get pregnant again after a pregnancy loss and deliver a healthy baby. How to manage emotional recovery Pregnancy loss is very hard emotionally. You may feel many different emotions while you grieve. You may feel sad and angry. You may also feel guilty. It is normal to have periods of crying. Emotional recovery can take longer than physical recovery. It is different for everyone. Taking these steps can help you in managing this loss:  Remember that it is unlikely  you did anything to cause the pregnancy loss.  Share your thoughts and feelings with friends, family, and your partner. Remember that your partner is also recovering emotionally.  Make sure you have a good support system. Do not spend too much time alone.  Meet with a pregnancy loss counselor or join a pregnancy loss support group.  Get enough sleep and eat a healthy diet. Return to regular exercise when you have recovered physically.  Do not use drugs or alcohol to manage your emotions.  Consider seeing a mental health professional to help you recover emotionally.  Ask a friend or loved one to help you decide  what to do with any clothing and nursery items you received for your baby. In the case of a stillbirth, many women benefit from taking additional steps in the grieving process. You may want to:  Hold your baby after the birth.  Name your baby.  Request a birth certificate.  Create a keepsake such as handprints or footprints.  Dress your baby and have a picture taken.  Make funeral arrangements.  Ask for a baptism or blessing. Hospitals have staff members who can help you with all these arrangements.   How to recognize emotional stress It is normal to have emotional stress after a pregnancy loss. But emotional stress that lasts a long time or becomes severe requires treatment. Watch out for these signs of severe emotional stress:  Sadness, anger, or guilt that is not going away and is interfering with your normal activities.  Relationship problems that have occurred or gotten worse since the pregnancy loss.  Signs of depression that last longer than 2 weeks. These may include: ? Sadness. ? Anxiety. ? Hopelessness. ? Loss of interest in activities you enjoy. ? Inability to concentrate. ? Trouble sleeping or sleeping too much. ? Loss of appetite or overeating. ? Thoughts of death or of hurting yourself. Follow these instructions at home:  Take over-the-counter and prescription medicines only as told by your health care provider.  Rest at home until your energy level returns. Return to your normal activities as told by your health care provider. Ask your health care provider what activities are safe for you.  When you are ready, meet with your health care provider to discuss steps to take for a future pregnancy.  Keep all follow-up visits as told by your health care provider. This is important. Where to find support  To help you and your partner with the process of grieving, talk with your health care provider or seek counseling.  Consider meeting with others who have  experienced pregnancy loss. Ask your health care provider about support groups and resources. Where to find more information  U.S. Department of Health and Programmer, systems on Women's Health: [place]BeachSigns.tn  American Pregnancy Association: www.americanpregnancy.org Contact a health care provider if:  You continue to experience grief, sadness, or lack of motivation for everyday activities, and those feelings do not improve over time.  You are struggling to recover emotionally, especially if you are using alcohol or substances to help. Get help right away if:  You have thoughts of hurting yourself or others. If you ever feel like you may hurt yourself or others, or have thoughts about taking your own life, get help right away. You can go to your nearest emergency department or call:  Your local emergency services (911 in the [place]  A suicide crisis helpline, such as the [facility] at 985-688-3203. This is open 24 hours  a day. Summary  Any pregnancy loss can be difficult physically and emotionally.  You may experience many different emotions while you grieve. Emotional recovery can last longer than physical recovery.  It is normal to have emotional stress after a pregnancy loss. But emotional stress that lasts a long time or becomes severe requires treatment.  See your health care provider if you are struggling emotionally after a pregnancy loss. This information is not intended to replace advice given to you by your health care provider. Make sure you discuss any questions you have with your health care provider. Document Revised: 06/01/2018 Document Reviewed: 04/22/2017 Elsevier Patient Education  2021 [employer].

## 2020-04-15 ENCOUNTER — Other Ambulatory Visit: Payer: Self-pay | Admitting: *Deleted

## 2020-04-15 DIAGNOSIS — O039 Complete or unspecified spontaneous abortion without complication: Secondary | ICD-10-CM

## 2020-04-16 ENCOUNTER — Other Ambulatory Visit: Payer: Self-pay

## 2020-04-16 DIAGNOSIS — O039 Complete or unspecified spontaneous abortion without complication: Secondary | ICD-10-CM

## 2020-04-17 LAB — BETA HCG QUANT (REF LAB): hCG Quant: 44 m[IU]/mL

## 2020-04-23 ENCOUNTER — Other Ambulatory Visit: Payer: Self-pay

## 2020-04-23 ENCOUNTER — Encounter: Payer: Self-pay | Admitting: Family Medicine

## 2020-04-23 ENCOUNTER — Ambulatory Visit (INDEPENDENT_AMBULATORY_CARE_PROVIDER_SITE_OTHER): Payer: Self-pay | Admitting: Family Medicine

## 2020-04-23 VITALS — BP 120/61 | HR 71 | Ht 64.0 in | Wt 248.2 lb

## 2020-04-23 DIAGNOSIS — O039 Complete or unspecified spontaneous abortion without complication: Secondary | ICD-10-CM

## 2020-04-23 NOTE — Progress Notes (Signed)
GYNECOLOGY OFFICE VISIT NOTE  History:   Linda Bond is a 25 y.o. [phone] here today for follow up complete sab. She denies any abnormal vaginal discharge, bleeding, pelvic pain or other concerns.    Patient initially presented to [facility] 04/05/20 with vaginal bleeding and was found to have IUP at 6wks with hcg 11708. She then presented to [facility] 04/09/20 after reported passage of products with vaginal bleeding abdominal cramping and ultrasound demonstrated no yolk sac or cardiac activity with hcg 961. Repeat hcg 04/16/20 was 44. Patient presents to clinic today for follow up. Patient denies any further vaginal bleeding or cramping. No fever/chills. No lightheadedness or dizziness. This was an unplanned pregnancy, but patient not actively preventing pregnancy, no contraception. Patient unsure if she desires future pregnancy.     History reviewed. No pertinent past medical history.  Past Surgical History:  Procedure Laterality Date  . NO PAST SURGERIES      The following portions of the patient's history were reviewed and updated as appropriate: allergies, current medications, past family history, past medical history, past social history, past surgical history and problem list.   Health Maintenance:  Unsure if she has previously had pap smear, she follows with [facility] for some of her gyn care.  Review of Systems:  Pertinent items noted in HPI and remainder of comprehensive ROS otherwise negative.  Physical Exam:  BP 120/61   Pulse 71   Ht 5\' 4"  (1.626 m)   Wt 248 lb 3.2 oz (112.6 kg)   LMP 02/04/2020 (Approximate)   BMI 42.60 kg/m  CONSTITUTIONAL: Well-developed, well-nourished female in no acute distress.  HEENT:  Normocephalic, atraumatic. External right and left ear normal. No scleral icterus.  NECK: Normal range of motion, supple, no masses noted on observation SKIN: No rash noted. Not diaphoretic. No erythema. No pallor. MUSCULOSKELETAL: Normal range of motion. No edema  noted. NEUROLOGIC: Alert and oriented to person, place, and time. Normal muscle tone coordination. No cranial nerve deficit noted. PSYCHIATRIC: Normal mood and affect. Normal behavior. Normal judgment and thought content. CARDIOVASCULAR: Normal heart rate noted RESPIRATORY: Effort and breath sounds normal, no problems with respiration noted ABDOMEN: No masses noted. No other overt distention noted.   PELVIC: Deferred  Labs and Imaging Results for orders placed or performed in visit on 04/16/20 (from the past 168 hour(s))  Beta hCG quant (ref lab)   Collection Time: 04/16/20  9:57 AM  Result Value Ref Range   hCG Quant 44 mIU/mL   04/18/20 OB Transvaginal  Result Date: 04/09/2020 CLINICAL DATA:  Vaginal bleeding EXAM: TRANSVAGINAL OB ULTRASOUND TECHNIQUE: Transvaginal ultrasound was performed for complete evaluation of the gestation as well as the maternal uterus, adnexal regions, and pelvic cul-de-sac. COMPARISON:  04/05/2020 FINDINGS: Intrauterine gestational sac: None Yolk sac:  Not Visualized. Embryo:  Not Visualized. Cardiac Activity: Not Visualized. Heart Rate:  bpm MSD:   mm    w     d CRL:     mm    w  d                  06/03/2020 EDC: Subchorionic hemorrhage:  None visualized. Maternal uterus/adnexae: No adnexal mass or free fluid. Neither ovary visualized. Endometrium 13 mm in thickness. Fundal fibroid measures 3.7 cm. IMPRESSION: Previously seen intrauterine gestation no longer visualized compatible with spontaneous abortion. Electronically Signed   By: [place] M.D.   On: 04/09/2020 16:51   04/11/2020 OB LESS THAN 14 WEEKS WITH OB TRANSVAGINAL  Result Date: 04/05/2020 CLINICAL DATA:  Pregnant patient in first-trimester pregnancy with vaginal bleeding today. Gestational age [redacted] weeks 5 days by LMP. EXAM: OBSTETRIC <14 WK [place] AND TRANSVAGINAL OB US TECHNIQUE: Both transabdominal and transvaginal ultrasound examinations were performed for complete evaluation of the gestation as well as the maternal uterus,  adnexal regions, and pelvic cul-de-sac. Transvaginal technique was performed to assess early pregnancy. COMPARISON:  None this pregnancy. FINDINGS: Intrauterine gestational sac: Single Yolk sac:  Visualized, enlarged measuring 10 mm. Embryo:  Visualized. Cardiac Activity: Not Visualized. CRL:  4.1 mm   6 w   1 d                  [place] EDC: 11/28/2020 Subchorionic hemorrhage:  None visualized. Maternal uterus/adnexae: Hyperechoic 3.6 cm fibroid in the fundus anteriorly. Both ovaries are visualized and are normal. There is no adnexal mass. No pelvic free fluid. IMPRESSION: Intrauterine gestational sac containing an enlarged yolk sac and fetal pole at 4 mm corresponding to gestational age of [redacted] weeks 1 day, but no cardiac activity. Findings are suspicious but not yet definitive for failed pregnancy. Recommend follow-up US in 10-14 days for definitive diagnosis. This recommendation follows SRU consensus guidelines: Diagnostic Criteria for Nonviable Pregnancy Early in the First Trimester. Malva Limes Med 2013; 962:2297-98. Electronically Signed   By: Narda Rutherford M.D.   On: 04/05/2020 19:39      Assessment and Plan:      25 y.o. presents for SAB follow up:  Complete abortion Previously visualized IUP on ultrasound with cardiac activity, yolk sac, gestational sac. Repeat u/s 04/09/20 demonstrated no cardiac activity or yolk sac. hcg was downtrending, most recent 44 down from 11,708. VSS. Hgb stable, 11.5 at last check. Patient denies symptoms today. Given yolk sac not visualized on repeat ultrasound and bhcg downtrending will not repeat hcg or ultrasound today. Discussed case with Dr. Vergie Living, he is in agreement. This was an unplanned pregnancy. Discussed return of menstrual cycle and contraception. Patient desires depo for contraception, given no insurance will obtain at health department. Patient also encouraged to ensure her pap smear [place] UTD.    Routine preventative health maintenance measures  emphasized. Please refer to After Visit Summary for other counseling recommendations.   Return if symptoms worsen or fail to improve.    Total face-to-face time with patient: 30 minutes.  Over 50% of encounter was spent on counseling and coordination of care.   Alric Seton, MD OB Fellow, Faculty [facility], Center for [facility] 04/23/2020 8:46 AM

## 2020-04-23 NOTE — Patient Instructions (Addendum)
-follow up for birth control at health department -ensure pap smear is up to date, every 3 years -wait 1 menstrual cycle prior to trying to get pregnant again   Miscarriage A miscarriage is the loss of a pregnancy before the 20th week of pregnancy. Sometimes, a pregnancy ends before a woman knows that she is pregnant. If you lose a pregnancy, talk with your doctor about:  Questions you have about the loss of your baby.  How to work through your grief.  Plans for future pregnancy. What are the causes? Many times, the cause of this condition is not known. What increases the risk? These things may make a pregnant woman more likely to lose a pregnancy: Certain health conditions  Conditions that affect hormones, such as: ? Thyroid disease. ? Polycystic ovary syndrome.  Diabetes.  A disease that causes the body's disease-fighting system to attack itself by mistake.  Infections.  Bleeding problems.  Being very overweight. Lifestyle factors  Using products that have tobacco or nicotine in them.  Being around tobacco smoke.  Having alcohol.  Having a lot of caffeine.  Using drugs. Problems with reproductive organs or parts  Having a cervix that opens and thins before your due date. The cervix is the lowest part of your womb.  Having Asherman syndrome, which leads to: ? Scars in the womb. ? The womb being abnormal in shape.  Growths (fibroids) in the womb.  Problems in the body that are present at birth.  Infection of the cervix or womb. Personal or health history  Injury.  Having lost a pregnancy before.  Being younger than age 73 or older than age 81.  Being around a harmful substance, such as radiation.  Having lead or other heavy metals in: ? Things you eat or drink. ? The air around you.  Using certain medicines. What are the signs or symptoms?  Blood or spots of blood coming from the vagina. You may also have cramps or pain.  Pain or cramps in  the belly or low back.  Fluid or tissue coming out of the vagina. How is this treated? Sometimes, treatment is not needed. If you need treatment, you may be treated with:  A procedure to open the cervix more and take tissue out of the womb.  Medicines. You may get a shot of medicine called Rho(D) immune globulin. Follow these instructions at home: Medicines  Take over-the-counter and prescription medicines only as told by your doctor.  If you were prescribed antibiotic medicine, take it as told by your doctor. Do not stop taking it even if you start to feel better. Activity  Rest as told by your doctor. Ask your doctor what activities are safe for you.  Have someone help you at home during this time. General instructions  Watch how much tissue comes out of the vagina.  Watch the size of any blood clots that come out of the vagina.  Do not have sex or douche until your doctor says it is okay.  Do not put things, such as tampons, in your vagina until your doctor says it is okay.  To help you and your partner with grieving: ? Talk with your doctor. ? See a Veterinary surgeon.  When you are ready, talk with your doctor about: ? Things to do for your health. ? How you can be healthy if you get pregnant again.  Keep all follow-up visits.   Where to find more information  The [employer] of Obstetricians and Gynecologists:  http://www.walters-mcconnell.com/  U.S. Department of Health and Cytogeneticist of Women's Health: http://hoffman.com/ Contact a doctor if:  You have a fever or chills.  There is bad-smelling fluid coming from the vagina.  You have more bleeding.  Tissue or clots of blood come out of your vagina. Get help right away if:  You have very bad cramps or pain in your back or belly.  You soak more than 2 large pads in an hour for more than 2 hours.  You get light-headed or weak.  You faint.  You feel sad, and you have sad thoughts a lot of the time.  You  think about hurting yourself. Get help right awayif you feel like you may hurt yourself or others, or have thoughts about taking your own life. Go to your nearest emergency room or:  Call your local emergency services (911 in the [place]  Call the National Suicide Prevention Lifeline at 539-599-8763. This is open 24 hours a day.  Text the Crisis Text Line at (336)826-8002. Summary  A miscarriage is the loss of a pregnancy before the 20th week of pregnancy. Sometimes, a pregnancy ends before a woman knows that she is pregnant.  Follow instructions from your doctor about medicines and activity.  To help you and your partner with grieving, talk with your doctor or a counselor.  Keep all follow-up visits. This information is not intended to replace advice given to you by your health care provider. Make sure you discuss any questions you have with your health care provider. Document Revised: 08/11/2019 Document Reviewed: 08/11/2019 Elsevier Patient Education  2021 [employer].

## 2021-03-24 ENCOUNTER — Ambulatory Visit: Payer: Self-pay | Attending: Critical Care Medicine | Admitting: Critical Care Medicine

## 2022-09-14 IMAGING — US US OB < 14 WEEKS - US OB TV
1 series · 15 of 28 positions shown · non-contrast
Comparison: None this pregnancy.

CLINICAL DATA: Pregnant patient in first-trimester pregnancy with
vaginal bleeding today. Gestational age 8 weeks 5 days by LMP.

EXAM:
OBSTETRIC <14 WK US AND TRANSVAGINAL OB US
TECHNIQUE: Both transabdominal and transvaginal ultrasound examinations were
performed for complete evaluation of the gestation as well as the
maternal uterus, adnexal regions, and pelvic cul-de-sac.
Transvaginal technique was performed to assess early pregnancy.

[Series 1: us ob < 14 weeks - us ob tv · 15 of 59 slices shown]
[im 1/59]
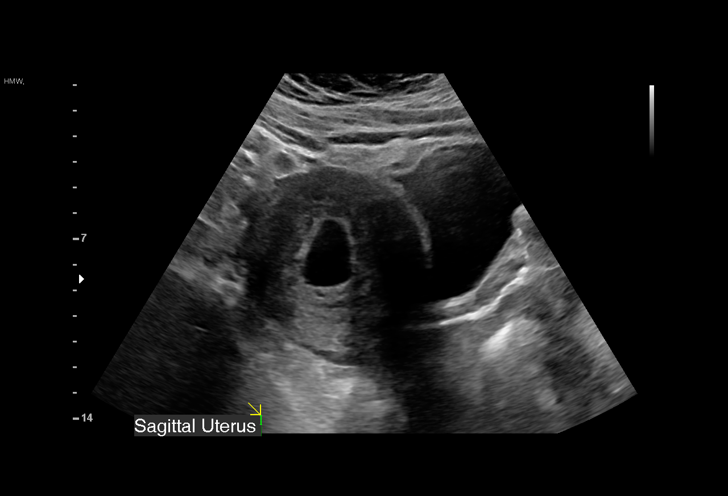
[im 5/59]
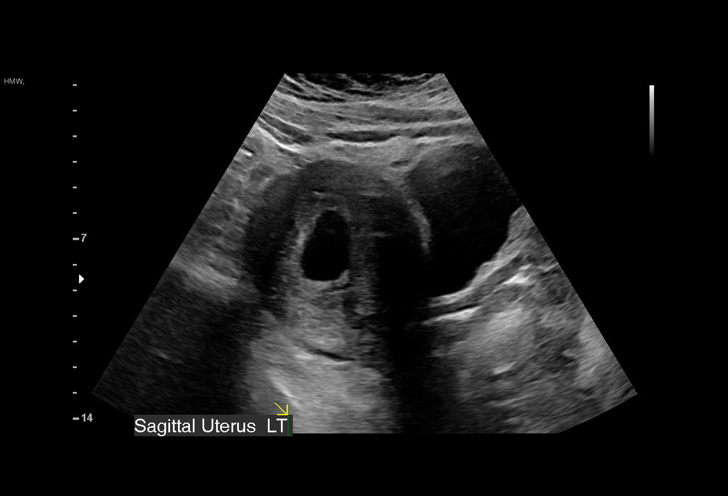
[im 9/59]
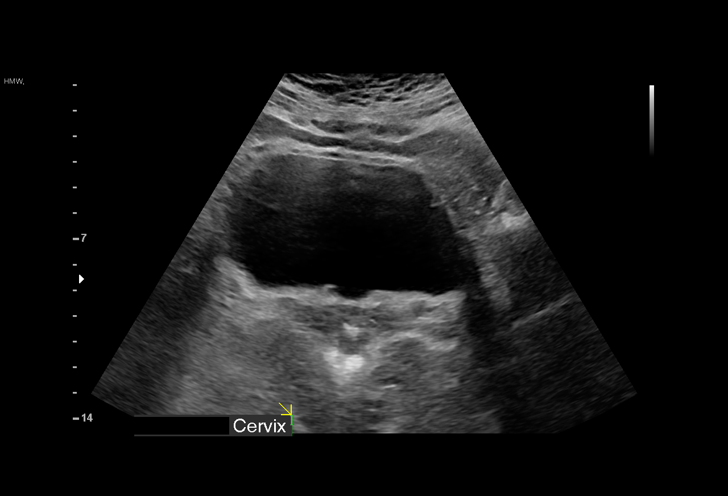
[im 13/59]
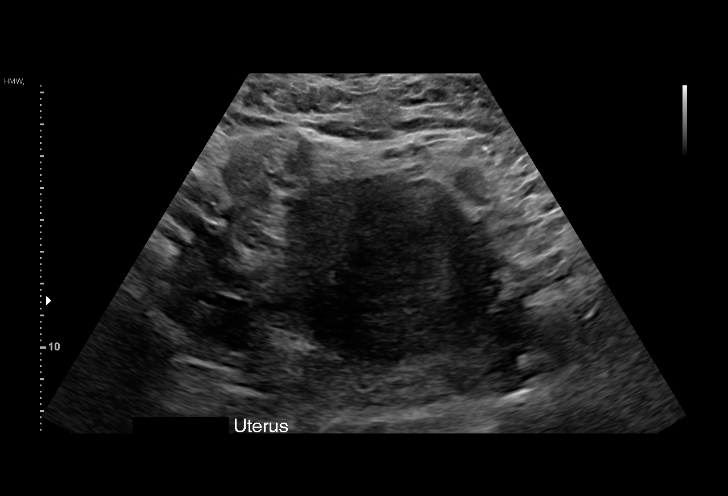
[im 18/59]
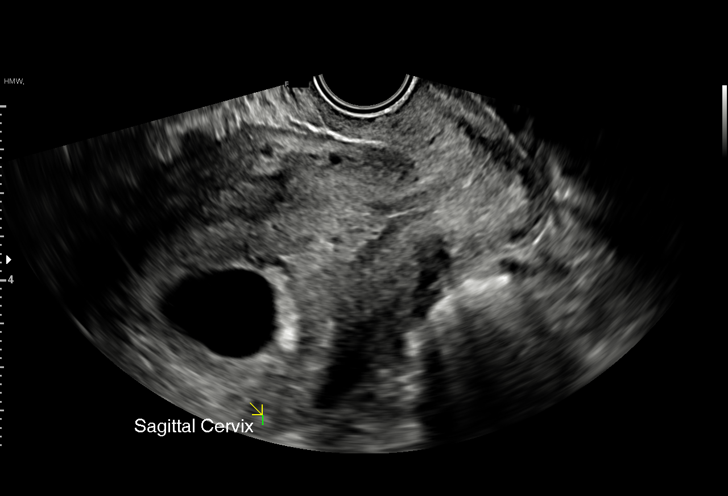
[im 22/59]
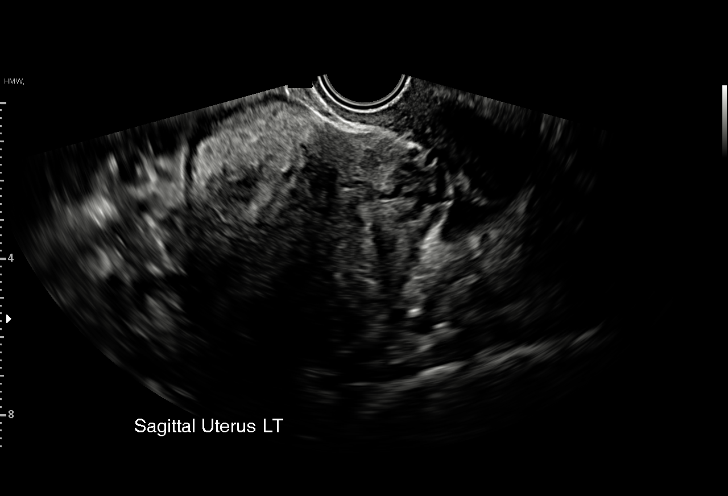
[im 26/59]
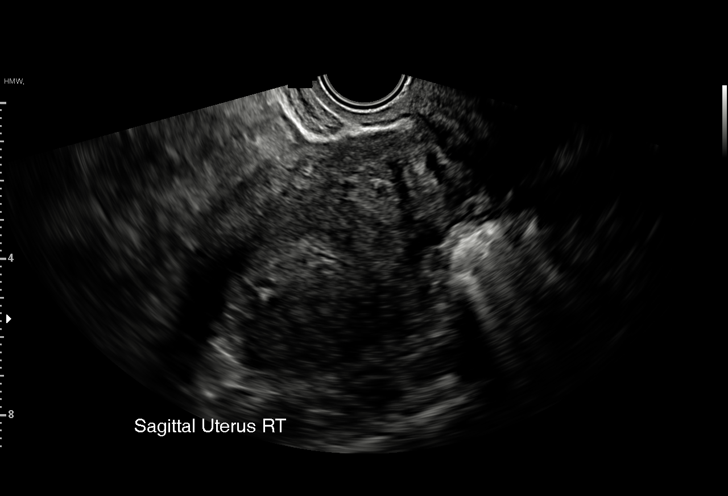
[im 31/59]
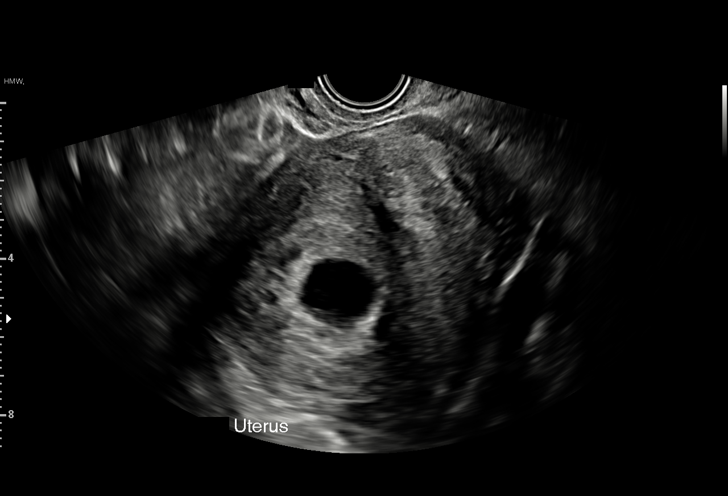
[im 33/59]
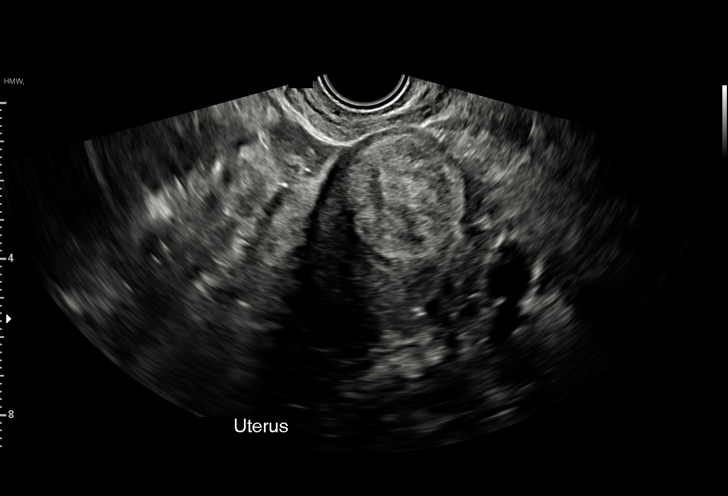
[im 37/59]
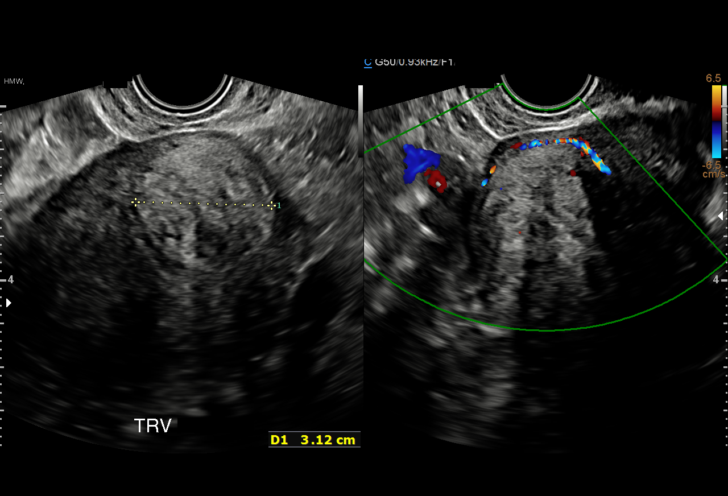
[im 41/59]
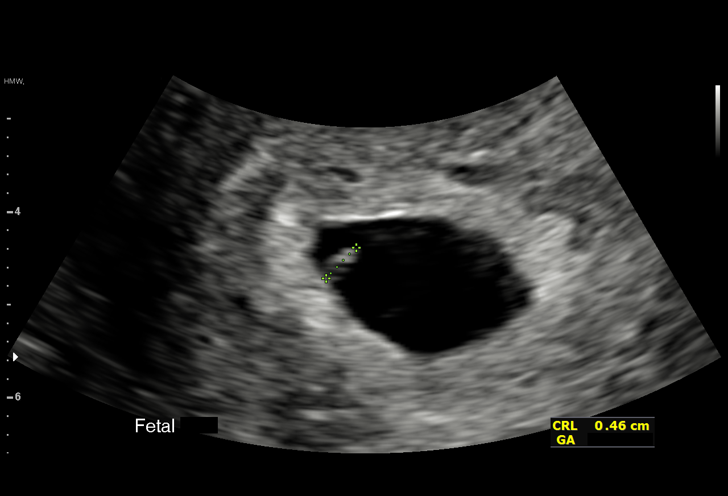
[im 46/59]
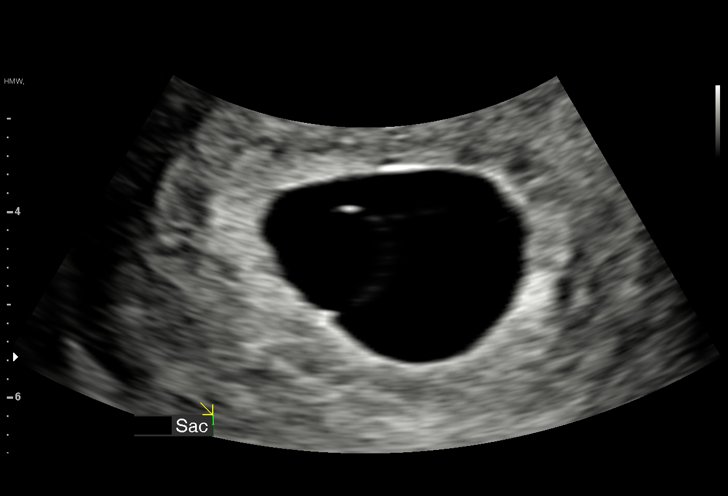
[im 50/59]
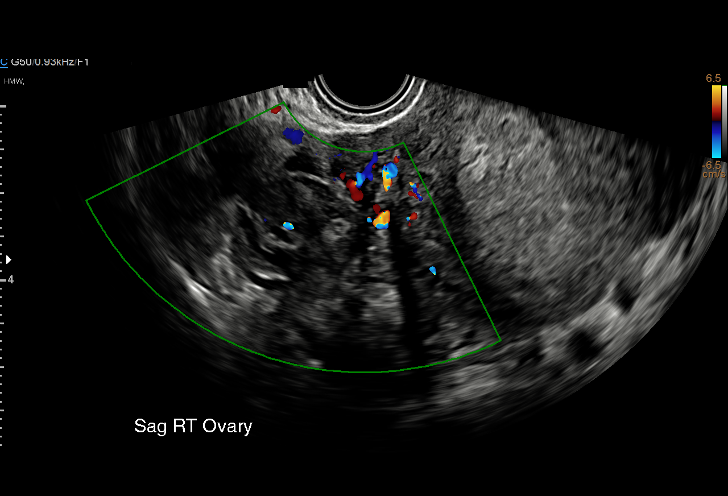
[im 54/59]
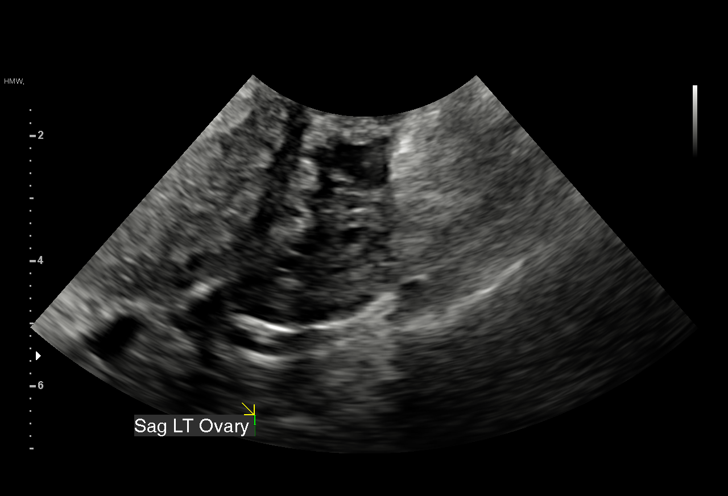
[im 59/59]
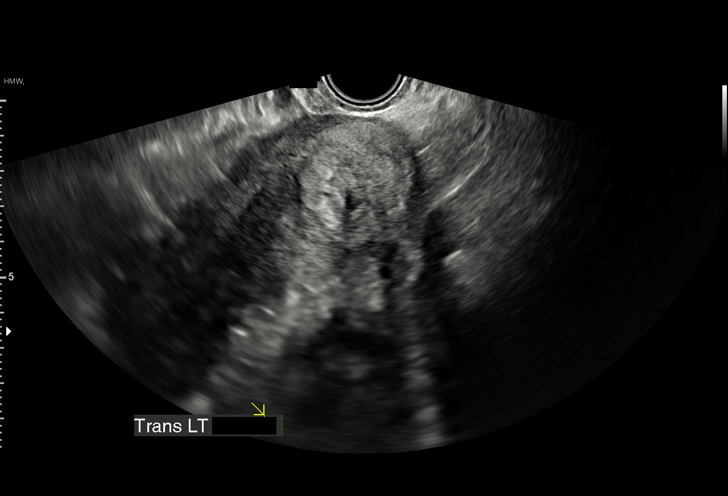

[15 of 28 positions shown; findings below may reference images not displayed]

FINDINGS: Intrauterine gestational sac: Single

Yolk sac:  Visualized, enlarged measuring 10 mm.

Embryo:  Visualized.

Cardiac Activity: Not Visualized.

CRL:  4.1 mm   6 w   1 d                  US EDC: 11/28/2020

Subchorionic hemorrhage:  None visualized.

Maternal uterus/adnexae: Hyperechoic 3.6 cm fibroid in the fundus
anteriorly. Both ovaries are visualized and are normal. There is no
adnexal mass. No pelvic free fluid.
IMPRESSION: Intrauterine gestational sac containing an enlarged yolk sac and
fetal pole at 4 mm corresponding to gestational age of 6 weeks 1
day, but no cardiac activity. Findings are suspicious but not yet
definitive for failed pregnancy. Recommend follow-up US in 10-14
days for definitive diagnosis. This recommendation follows SRU
consensus guidelines: Diagnostic Criteria for Nonviable Pregnancy
Early in the First Trimester. N Engl J Med 1632; [DATE].

## 2022-09-18 IMAGING — US US OB TRANSVAGINAL
1 series · 15 of 28 positions shown · non-contrast
Comparison: 04/05/2020

CLINICAL DATA: Vaginal bleeding

EXAM:
TRANSVAGINAL OB ULTRASOUND
TECHNIQUE: Transvaginal ultrasound was performed for complete evaluation of the
gestation as well as the maternal uterus, adnexal regions, and
pelvic cul-de-sac.

[Series 1: us ob transvaginal · 15 of 30 slices shown]
[im 1/30]
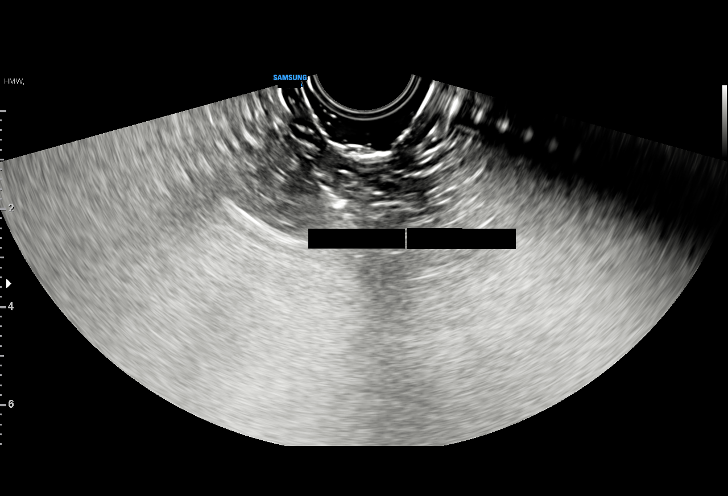
[im 3/30]
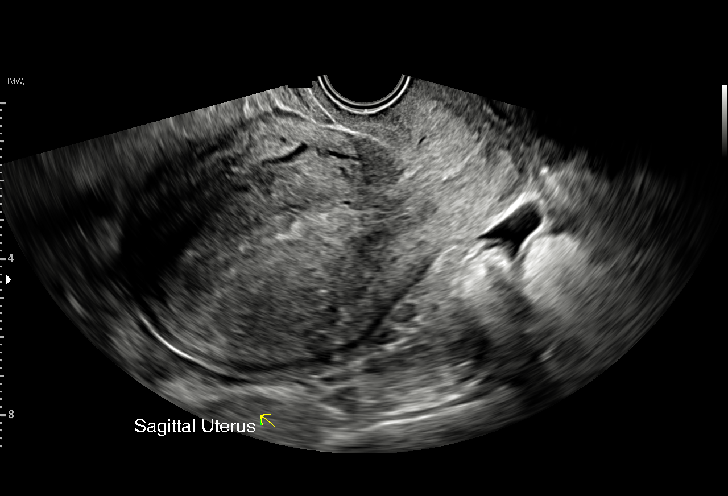
[im 5/30]
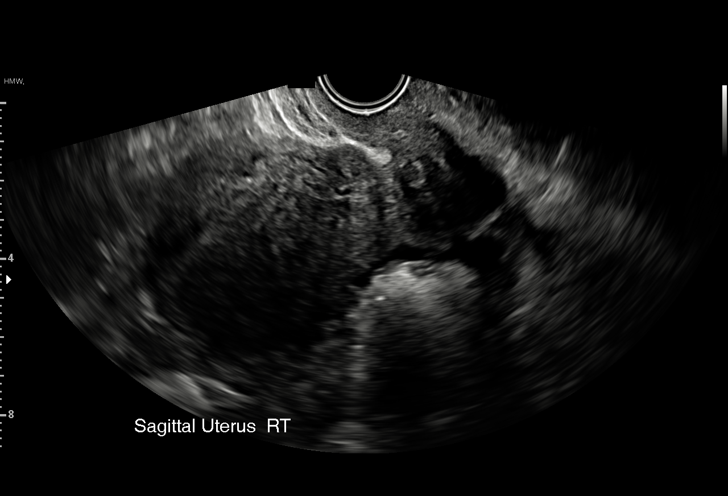
[im 7/30]
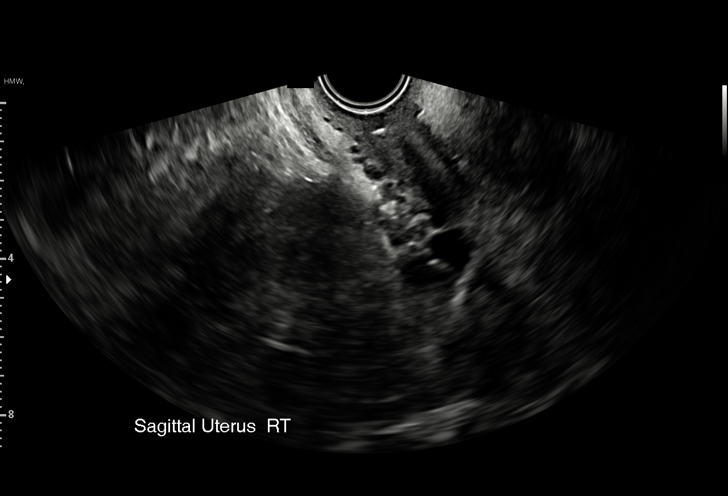
[im 9/30]
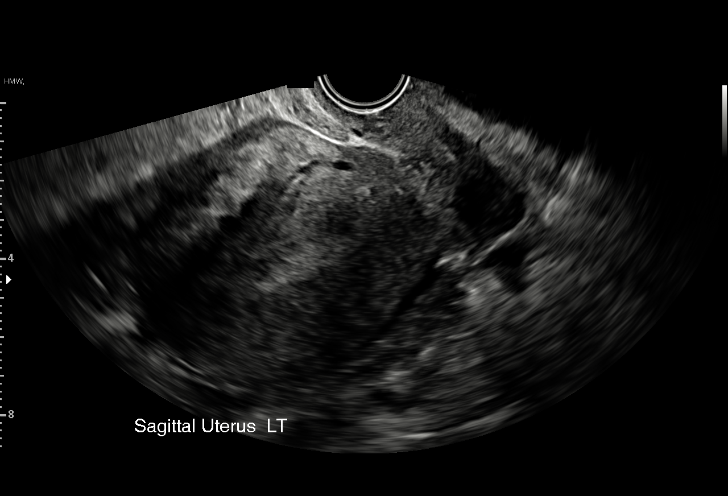
[im 11/30]
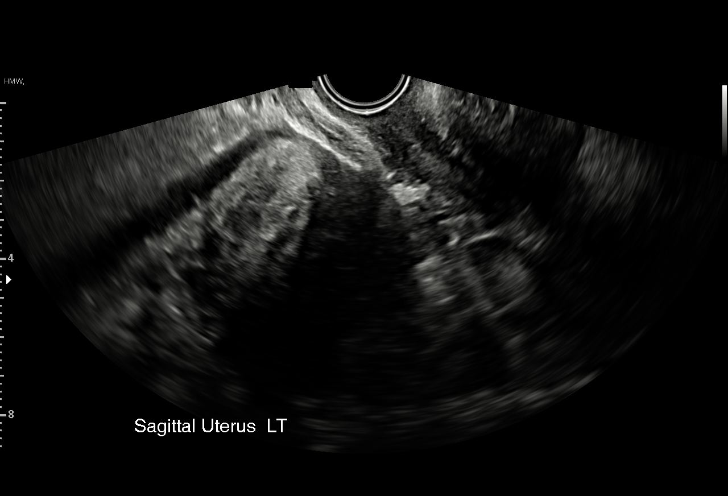
[im 13/30]
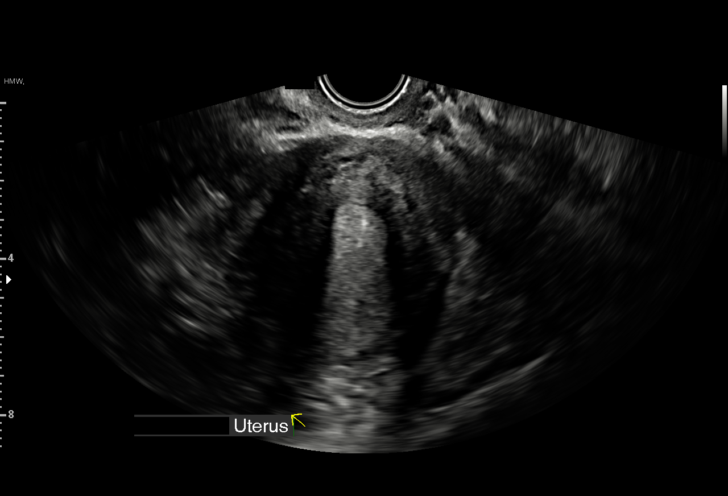
[im 16/30]
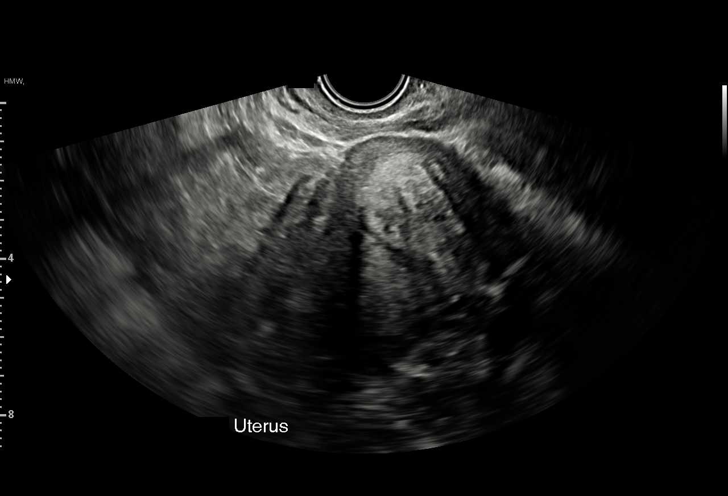
[im 17/30]
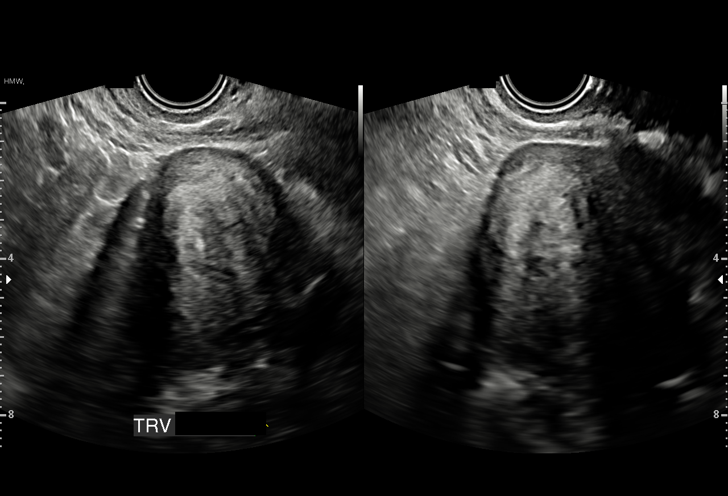
[im 19/30]
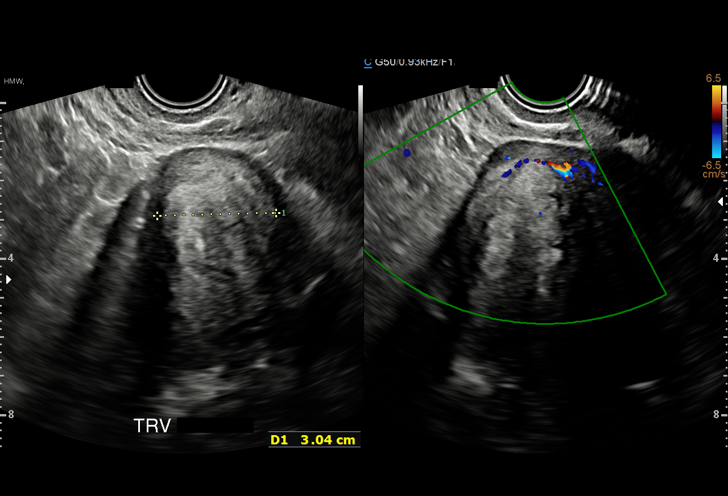
[im 21/30]
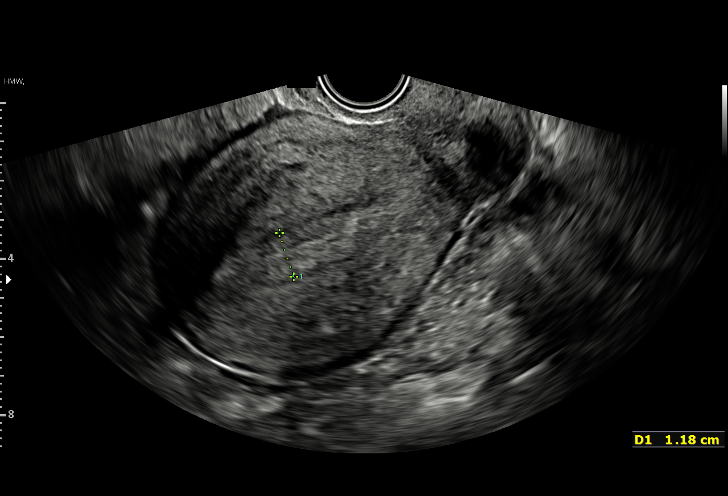
[im 23/30]
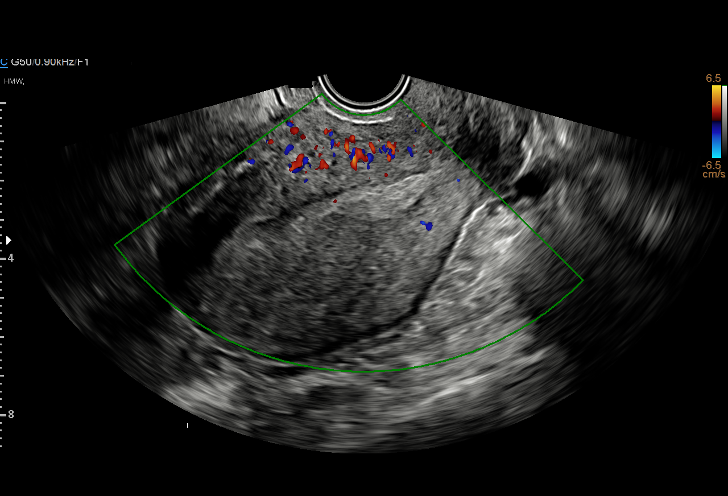
[im 25/30]
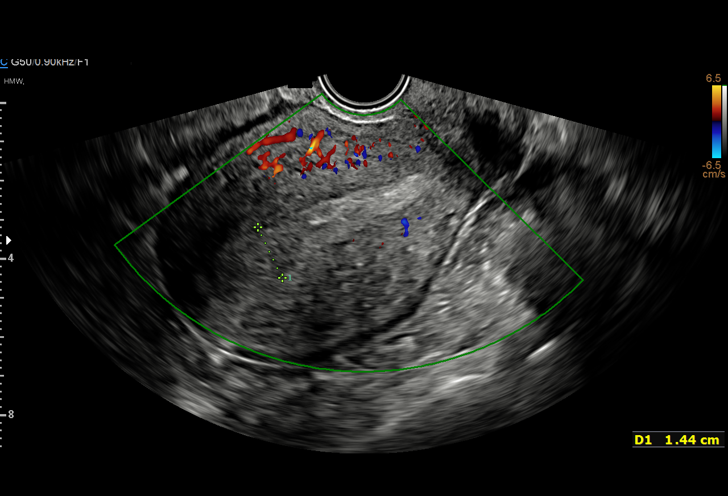
[im 27/30]
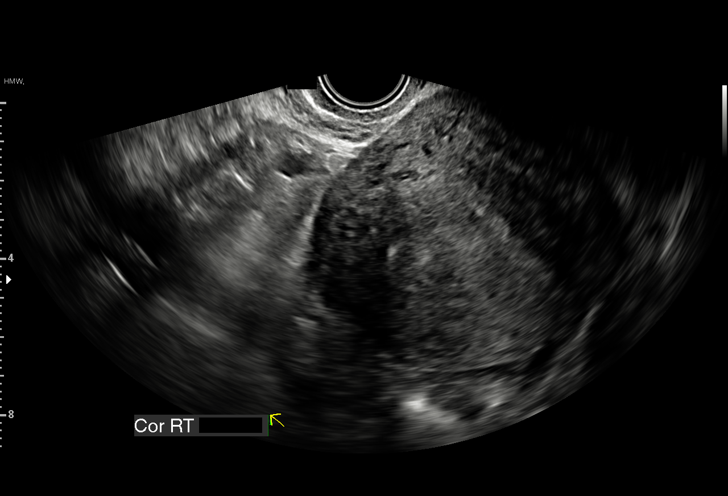
[im 30/30]
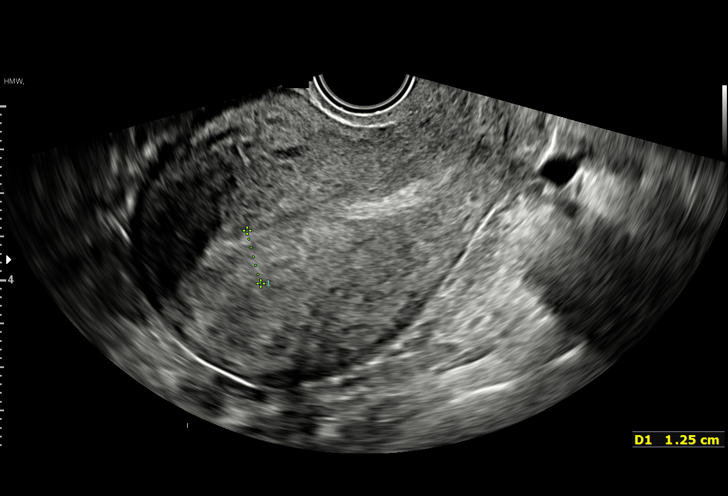

[15 of 28 positions shown; findings below may reference images not displayed]

FINDINGS: Intrauterine gestational sac: None

Yolk sac:  Not Visualized.

Embryo:  Not Visualized.

Cardiac Activity: Not Visualized.

Heart Rate:  bpm

MSD:   mm    w     d

CRL:     mm    w  d                  US EDC:

Subchorionic hemorrhage:  None visualized.

Maternal uterus/adnexae: No adnexal mass or free fluid. Neither
ovary visualized. Endometrium 13 mm in thickness. Fundal fibroid
measures 3.7 cm.
IMPRESSION: Previously seen intrauterine gestation no longer visualized
compatible with spontaneous abortion.

## 2023-04-21 ENCOUNTER — Ambulatory Visit
Admission: EM | Admit: 2023-04-21 | Discharge: 2023-04-21 | Disposition: A | Discharge status: Home / Self Care | Payer: Self-pay | Attending: Nurse Practitioner | Admitting: Nurse Practitioner

## 2023-04-21 DIAGNOSIS — N3000 Acute cystitis without hematuria: Secondary | ICD-10-CM | POA: Insufficient documentation

## 2023-04-21 DIAGNOSIS — R300 Dysuria: Secondary | ICD-10-CM | POA: Insufficient documentation

## 2023-04-21 LAB — POCT URINALYSIS DIP (MANUAL ENTRY)
Bilirubin, UA: NEGATIVE
Glucose, UA: NEGATIVE mg/dL
Ketones, POC UA: NEGATIVE mg/dL
Nitrite, UA: NEGATIVE
Protein Ur, POC: 100 mg/dL — AB
Spec Grav, UA: 1.02 (ref 1.010–1.025)
Urobilinogen, UA: 0.2 U/dL
pH, UA: 7 (ref 5.0–8.0)

## 2023-04-21 LAB — POCT URINE PREGNANCY: Preg Test, Ur: NEGATIVE

## 2023-04-21 MED ORDER — NITROFURANTOIN MONOHYD MACRO 100 MG PO CAPS: 100.0000 mg | ORAL_CAPSULE | Freq: Two times a day (BID) | ORAL | 0 refills | Status: AC

## 2023-04-21 MED ORDER — PHENAZOPYRIDINE HCL 200 MG PO TABS
200.0000 mg | ORAL_TABLET | Freq: Three times a day (TID) | ORAL | 0 refills | Status: AC
Start: 2023-04-21 — End: ?

## 2023-04-21 NOTE — Discharge Instructions (Addendum)
You have an urinary tract infection (UTI).   Take antibiotics as prescribed.   Make sure you finish all the antibiotics even if you start to feel better.   We have sent your urine out for cultures which is a lab test to check for bacteria or other germs in your urine sample. You will only be notified about these results if we have to change your antibiotics.   Make sure you: Empty your bladder often and completely.  Do not hold urine for long periods of time. Empty your bladder after sex. Wipe from front to back after urinating or having a bowel movement if you are female. Drink enough fluid to keep your urine pale yellow.  Go to the ED immediately if:  You have severe pain in your back or your lower abdomen. You have a fever or chills. You have nausea or vomiting. 

## 2023-04-21 NOTE — ED Triage Notes (Signed)
 Pt presents withc/o dysuria and frequency X 4 days.   Pt denies abd pain and back pain

## 2023-04-21 NOTE — ED Provider Notes (Signed)
 [facility]    CSN: [id number] Arrival date & time: 04/21/23  1908      History   Chief Complaint Chief Complaint  Patient presents with   Urinary Frequency    HPI Linda Bond is a 28 y.o. female.    Subjective:  Linda Bond is a 28 year old female who presents with complaints of dysuria, urinary frequency, and urinary urgency for the past 3 days. She denies vaginal discharge, back pain, abdominal pain, or fevers. The patient has no history of recurrent urinary tract infections (UTIs) or pyelonephritis. Her last menstrual period was at the beginning of January, and she reports having irregular periods. She is not currently using birth control.  The following portions of the patient's history were reviewed and updated as appropriate: allergies, current medications, past family history, past medical history, past social history, past surgical history, and problem list.     History reviewed. No pertinent past medical history.  Patient Active Problem List   Diagnosis Date Noted   Obesity (BMI 30.0-34.9) 06/30/2017   Complete miscarriage 06/30/2017    Past Surgical History:  Procedure Laterality Date   NO PAST SURGERIES      OB History     Gravida  4   Para  2   Term  2   Preterm      AB  2   Living  2      SAB  2   IAB      Ectopic      Multiple      Live Births  2            Home Medications    Prior to Admission medications   Medication Sig Start Date End Date Taking? Authorizing Provider  nitrofurantoin, macrocrystal-monohydrate, (MACROBID) 100 MG capsule Take 1 capsule (100 mg total) by mouth 2 (two) times daily. 04/21/23  Yes Lurline Idol, FNP  phenazopyridine (PYRIDIUM) 200 MG tablet Take 1 tablet (200 mg total) by mouth 3 (three) times daily. 04/21/23  Yes Lurline Idol, FNP  fluticasone (FLONASE) 50 MCG/ACT nasal spray Place 2 sprays into both nostrils daily. Patient not taking: Reported on 04/23/2020 02/22/18    Rennis Harding, PA-C    Family History Family History  Problem Relation Age of Onset   Healthy Mother    Healthy Father     Social History Social History   Tobacco Use   Smoking status: Never   Smokeless tobacco: Never  Substance Use Topics   Alcohol use: No   Drug use: No     Allergies   Patient has no known allergies.   Review of Systems Review of Systems  Constitutional:  Negative for fever.  Gastrointestinal:  Negative for nausea and vomiting.  Genitourinary:  Positive for dysuria, frequency and urgency. Negative for flank pain, genital sores, hematuria, menstrual problem, pelvic pain, vaginal bleeding, vaginal discharge and vaginal pain.  Musculoskeletal:  Negative for back pain.  All other systems reviewed and are negative.    Physical Exam Triage Vital Signs ED Triage Vitals  Encounter Vitals Group     BP 04/21/23 1935 (!) 145/80     Systolic BP Percentile --      Diastolic BP Percentile --      Pulse Rate 04/21/23 1934 100     Resp 04/21/23 1934 16     Temp --      Temp src --      SpO2 04/21/23 1934 99 %  Weight --      Height --      Head Circumference --      Peak Flow --      Pain Score 04/21/23 1933 0     Pain Loc --      Pain Education --      Exclude from Growth Chart --    No data found.  Updated Vital Signs BP (!) 145/80   Pulse 100   Resp 16   LMP  (LMP Unknown)   SpO2 99%   Breastfeeding No   Visual Acuity Right Eye Distance:   Left Eye Distance:   Bilateral Distance:    Right Eye Near:   Left Eye Near:    Bilateral Near:     Physical Exam Vitals reviewed.  Constitutional:      Appearance: Normal appearance.  HENT:     Head: Normocephalic.  Cardiovascular:     Rate and Rhythm: Normal rate and regular rhythm.  Pulmonary:     Effort: Pulmonary effort is normal.     Breath sounds: Normal breath sounds.  Abdominal:     Palpations: Abdomen is soft.     Tenderness: There is no right CVA tenderness or left CVA  tenderness.  Musculoskeletal:        General: Normal range of motion.     Cervical back: Normal range of motion and neck supple.  Skin:    General: Skin is warm and dry.  Neurological:     General: No focal deficit present.     Mental Status: She is alert and oriented to person, place, and time.  Psychiatric:        Mood and Affect: Mood normal.        Behavior: Behavior normal.      UC Treatments / Results  Labs (all labs ordered are listed, but only abnormal results are displayed) Labs Reviewed  POCT URINALYSIS DIP (MANUAL ENTRY) - Abnormal; Notable for the following components:      Result Value   Clarity, UA cloudy (*)    Blood, UA trace-intact (*)    Protein Ur, POC =100 (*)    Leukocytes, UA Moderate (2+) (*)    All other components within normal limits  URINE CULTURE  POCT URINE PREGNANCY    EKG   Radiology No results found.  Procedures Procedures (including critical care time)  Medications Ordered in UC Medications - No data to display  Initial Impression / Assessment and Plan / UC Course  I have reviewed the triage vital signs and the nursing notes.  Pertinent labs & imaging results that were available during my care of the patient were reviewed by me and considered in my medical decision making (see chart for details).    28 year old female presents with dysuria, urinary frequency, and urgency for the past 3 days. She denies vaginal discharge, back pain, abdominal pain, or fevers. Her last menstrual period was at the beginning of January, and she has irregular periods and is not using birth control. Urine hCG is negative, and urinalysis is positive for leukocytes, with negative nitrites. Symptoms and lab results are consistent with an acute urinary tract infection (UTI). She was prescribed nitrofurantoin and Pyridium, and advised to drink plenty of fluids. Urine cultures are pending, and further treatment recommendations will be made based on the culture  results.  Today's evaluation has revealed no signs of a dangerous process. Discussed diagnosis with patient and/or guardian. Patient and/or guardian aware of their diagnosis, possible  red flag symptoms to watch out for and need for close follow up. Patient and/or guardian understands verbal and written discharge instructions. Patient and/or guardian comfortable with plan and disposition.  Patient and/or guardian has a clear mental status at this time, good insight into illness (after discussion and teaching) and has clear judgment to make decisions regarding their care  Documentation was completed with the aid of voice recognition software. Transcription may contain typographical errors. Final Clinical Impressions(s) / UC Diagnoses   Final diagnoses:  Dysuria  Acute cystitis without hematuria     Discharge Instructions      You have an urinary tract infection (UTI).   Take antibiotics as prescribed.   Make sure you finish all the antibiotics even if you start to feel better.   We have sent your urine out for cultures which is a lab test to check for bacteria or other germs in your urine sample. You will only be notified about these results if we have to change your antibiotics.   Make sure you: Empty your bladder often and completely.  Do not hold urine for long periods of time. Empty your bladder after sex. Wipe from front to back after urinating or having a bowel movement if you are female. Drink enough fluid to keep your urine pale yellow.  Go to the ED immediately if:  You have severe pain in your back or your lower abdomen. You have a fever or chills. You have nausea or vomiting.     ED Prescriptions     Medication Sig Dispense Auth. Provider   nitrofurantoin, macrocrystal-monohydrate, (MACROBID) 100 MG capsule Take 1 capsule (100 mg total) by mouth 2 (two) times daily. 10 capsule Lurline Idol, FNP   phenazopyridine (PYRIDIUM) 200 MG tablet Take 1 tablet (200 mg  total) by mouth 3 (three) times daily. 6 tablet Lurline Idol, FNP      PDMP not reviewed this encounter.   Lurline Idol, [place] 04/21/23 2013

## 2023-04-24 LAB — URINE CULTURE: Culture: >=100000 — AB

## 2023-04-26 ENCOUNTER — Telehealth (HOSPITAL_COMMUNITY): Payer: Self-pay

## 2023-04-26 MED ORDER — CEPHALEXIN 500 MG PO CAPS: 500.0000 mg | ORAL_CAPSULE | Freq: Two times a day (BID) | ORAL | 0 refills | Status: AC

## 2023-04-26 NOTE — Telephone Encounter (Signed)
 Per protocol, pt to dc Macrobid and begin treatment with Bactrim. Attempted to reach patient x1. LVM.  Rx sent to pharmacy on file.

## 2023-04-26 NOTE — Telephone Encounter (Signed)
 Pt returned call. Advised of med changes. Questions answered.  Correction to last note: requires treatment with Keflex, which was sent in.
